# Patient Record
Sex: Female | Born: 2005
Health system: Southern US, Community
[De-identification: ages and names within clinical notes are randomized; demographics above are authoritative.]

## PROBLEM LIST (undated history)

## (undated) DIAGNOSIS — R569 Unspecified convulsions: Secondary | ICD-10-CM

## (undated) DIAGNOSIS — D496 Neoplasm of unspecified behavior of brain: Secondary | ICD-10-CM

## (undated) HISTORY — PX: BRAIN BIOPSY: SHX905

---
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
## 2023-01-01 LAB — 10-HYDROXYCARBAZEPINE: Triliptal/MTB(Oxcarbazepin): 30 ug/mL (ref 8.0–35.0)

## 2023-01-01 LAB — LEVETIRACETAM LEVEL: Keppra (Levetiracetam): 23.2 ug/mL

## 2023-02-19 ENCOUNTER — Emergency Department (HOSPITAL_COMMUNITY): Payer: Medicaid Other

## 2023-02-19 ENCOUNTER — Emergency Department (HOSPITAL_COMMUNITY)
Admission: EM | Admit: 2023-02-19 | Discharge: 2023-02-19 | Disposition: A | Discharge status: Home / Self Care | Payer: Medicaid Other | Source: Home / Self Care | Attending: Emergency Medicine | Admitting: Emergency Medicine

## 2023-02-19 ENCOUNTER — Other Ambulatory Visit: Payer: Self-pay

## 2023-02-19 ENCOUNTER — Encounter (HOSPITAL_COMMUNITY): Payer: Self-pay

## 2023-02-19 DIAGNOSIS — R569 Unspecified convulsions: Secondary | ICD-10-CM | POA: Diagnosis present

## 2023-02-19 DIAGNOSIS — C719 Malignant neoplasm of brain, unspecified: Secondary | ICD-10-CM

## 2023-02-19 DIAGNOSIS — C710 Malignant neoplasm of cerebrum, except lobes and ventricles: Secondary | ICD-10-CM | POA: Insufficient documentation

## 2023-02-19 LAB — URINALYSIS, ROUTINE W REFLEX MICROSCOPIC
Bacteria, UA: NONE SEEN
Bilirubin Urine: NEGATIVE
Glucose, UA: NEGATIVE mg/dL
Hgb urine dipstick: NEGATIVE
Ketones, ur: NEGATIVE mg/dL
Leukocytes,Ua: NEGATIVE
Nitrite: NEGATIVE
Protein, ur: NEGATIVE mg/dL
Specific Gravity, Urine: 1.028 (ref 1.005–1.030)
pH: 5 (ref 5.0–8.0)

## 2023-02-19 LAB — CBC WITH DIFFERENTIAL/PLATELET
Abs Immature Granulocytes: 0.01 10*3/uL (ref 0.00–0.07)
Basophils Absolute: 0 10*3/uL (ref 0.0–0.1)
Basophils Relative: 1 %
Eosinophils Absolute: 0 10*3/uL (ref 0.0–0.5)
Eosinophils Relative: 1 %
HCT: 41.8 % (ref 36.0–46.0)
Hemoglobin: 13.1 g/dL (ref 12.0–15.0)
Immature Granulocytes: 0 %
Lymphocytes Relative: 26 %
Lymphs Abs: 1.5 10*3/uL (ref 0.7–4.0)
MCH: 27.2 pg (ref 26.0–34.0)
MCHC: 31.3 g/dL (ref 30.0–36.0)
MCV: 86.7 fL (ref 80.0–100.0)
Monocytes Absolute: 0.4 10*3/uL (ref 0.1–1.0)
Monocytes Relative: 7 %
Neutro Abs: 3.8 10*3/uL (ref 1.7–7.7)
Neutrophils Relative %: 65 %
Platelets: 326 10*3/uL (ref 150–400)
RBC: 4.82 MIL/uL (ref 3.87–5.11)
RDW: 14.3 % (ref 11.5–15.5)
WBC: 5.7 10*3/uL (ref 4.0–10.5)
nRBC: 0 % (ref 0.0–0.2)

## 2023-02-19 LAB — BASIC METABOLIC PANEL
Anion gap: 11 (ref 5–15)
BUN: 9 mg/dL (ref 6–20)
CO2: 24 mmol/L (ref 22–32)
Calcium: 9.5 mg/dL (ref 8.9–10.3)
Chloride: 101 mmol/L (ref 98–111)
Creatinine, Ser: 0.96 mg/dL (ref 0.44–1.00)
GFR, Estimated: >60 mL/min (ref >60)
Glucose, Bld: 98 mg/dL (ref 70–99)
Potassium: 4.3 mmol/L (ref 3.5–5.1)
Sodium: 136 mmol/L (ref 135–145)

## 2023-02-19 LAB — POC URINE PREG, ED: Preg Test, Ur: NEGATIVE

## 2023-02-19 MED ORDER — GADOBUTROL 1 MMOL/ML IV SOLN
7.0000 mL | Freq: Once | INTRAVENOUS | Status: AC | PRN
  Administered 2023-02-19: 7 mL via INTRAVENOUS

## 2023-02-19 MED ORDER — LEVETIRACETAM IN NACL 1500 MG/100ML IV SOLN
1500.0000 mg | Freq: Once | INTRAVENOUS | Status: AC
  Administered 2023-02-19: 1500 mg via INTRAVENOUS
  Filled 2023-02-19: qty 100

## 2023-02-19 MED ORDER — OXCARBAZEPINE 300 MG PO TABS
600.0000 mg | ORAL_TABLET | Freq: Once | ORAL | Status: AC
  Administered 2023-02-19: 600 mg via ORAL
  Filled 2023-02-19: qty 2

## 2023-02-19 NOTE — ED Provider Notes (Signed)
  Physical Exam  BP 106/64   Pulse 88   Temp 98.6 F (37 C) (Oral)   Resp (!) 22   Ht 5' (1.524 m)   Wt 72.2 kg   SpO2 100%   BMI 31.09 kg/m   Physical Exam Vitals and nursing note reviewed.  Constitutional:      General: She is not in acute distress.    Appearance: She is well-developed.  HENT:     Head: Normocephalic and atraumatic.  Eyes:     Conjunctiva/sclera: Conjunctivae normal.  Cardiovascular:     Rate and Rhythm: Normal rate and regular rhythm.     Heart sounds: No murmur heard. Pulmonary:     Effort: Pulmonary effort is normal. No respiratory distress.     Breath sounds: Normal breath sounds.  Abdominal:     Palpations: Abdomen is soft.     Tenderness: There is no abdominal tenderness.  Musculoskeletal:        General: No swelling.     Cervical back: Neck supple.  Skin:    General: Skin is warm and dry.     Capillary Refill: Capillary refill takes less than 2 seconds.  Neurological:     General: No focal deficit present.     Mental Status: She is alert and oriented to person, place, and time.     Cranial Nerves: No cranial nerve deficit.     Sensory: No sensory deficit.     Motor: No weakness.  Psychiatric:        Mood and Affect: Mood normal.     Procedures  Procedures  ED Course / MDM   Clinical Course as of 02/19/23 1903  Sat Feb 19, 2023  1511 Known glioma w seizures. Felt off for 24h. Had a couple absence seizures this morning. Was confused afterwards. Labs ok, getting MRI to ensure tumor has not changed. Negative, fu OP w neuro [WL]    Clinical Course User Index [WL] Dyanne Iha, MD   Medical Decision Making Amount and/or Complexity of Data Reviewed Labs: ordered. Radiology: ordered.  Risk Prescription drug management.   Upon reassessment, patient remains resting comfortably with no new symptoms.  Remains in mental baseline.  MRI resulted with mild interval increase in size of glioma.  No concern for hemorrhagic transformation  or new masses.  Bedside conversation held with patient and her mother regarding decision to stay for neurology consultation versus close follow-up outpatient.  Considering patient and her mother have been here for several hours, decision made to return home and follow-up closely with neurology as an outpatient.  Instructed to continue taking AEDs as prescribed for now.  Strict return precautions voiced.  Instructed to follow-up with primary care as well as neurology.   Dyanne Iha, MD 02/19/23 Julian Reil    Glyn Ade, MD 02/19/23 [phone]

## 2023-02-19 NOTE — ED Provider Notes (Signed)
[place] EMERGENCY DEPARTMENT AT [facility] Provider Note   CSN: [id number] Arrival date & time: 02/19/23  1252    History  Chief Complaint  Patient presents with   Seizures    Mary Salinas is a 17 y.o. female for evaluation of seizure-like activity.  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 Mervyn Skeeters bdelmoumenwith: Peds neuro.  She is compliant with her Keppra and Trileptal.  Has felt off the last few days.  No headache, numbness, weakness.  Spent the night at her friend's house.  Friend reported a few times for patient would be talking and then would stare off into space and not respond.  Had confusion afterwards.  On my initial evaluation she is back to baseline.  No fever, congestion, rhinorrhea, chest pain, shortness of breath, dysuria or hematuria.  Denies chance of pregnancy.  No sick contacts.  No falling to the ground, recent head injury.  Per mother last seizure was months ago.  HPI     Home Medications Prior to Admission medications   Medication Sig Start Date End Date Taking? Authorizing Provider  levETIRAcetam (KEPPRA) 750 MG tablet Take 2 tablets (1,500 mg total) by mouth 2 (two) times daily. 12/27/22   Abdelmoumen, Jenna Luo, MD  NAYZILAM 5 MG/0.1ML SOLN Place 5 mg into the nose as needed (place 1 spary into one nostril if seizures lasted longer than2- 3 minutes, may repeat a second dose after 10 minutes if still seizing.). Patient not taking: Reported on 09/29/2022 01/23/22   Lezlie Lye, MD  oxcarbazepine (TRILEPTAL) 600 MG tablet Take 1.5 tablets (900 mg total) by mouth 2 (two) times daily. 09/29/22 12/30/22  Lezlie Lye, MD      Allergies    Patient has no known allergies.    Review of Systems   Review of Systems  Constitutional: Negative.   HENT: Negative.    Respiratory: Negative.    Cardiovascular: Negative.   Gastrointestinal: Negative.   Genitourinary: Negative.    Musculoskeletal: Negative.   Skin: Negative.   Neurological:  Positive for seizures.  All other systems reviewed and are negative.   Physical Exam Updated Vital Signs BP (!) 113/91   Pulse 88   Temp 98.4 F (36.9 C) (Oral)   Resp 19   Ht 5' (1.524 m)   Wt 72.2 kg   SpO2 100%   BMI 31.09 kg/m  Physical Exam Vitals and nursing note reviewed.  Constitutional:      General: She is not in acute distress.    Appearance: She is well-developed. She is not ill-appearing, toxic-appearing or diaphoretic.  HENT:     Head: Normocephalic and atraumatic.     Nose: Nose normal.     Mouth/Throat:     Mouth: Mucous membranes are moist.  Eyes:     Pupils: Pupils are equal, round, and reactive to light.  Cardiovascular:     Rate and Rhythm: Normal rate.     Pulses: Normal pulses.     Heart sounds: Normal heart sounds.  Pulmonary:     Effort: Pulmonary effort is normal. No respiratory distress.     Breath sounds: Normal breath sounds.  Abdominal:     General: Bowel sounds are normal. There is no distension.     Palpations: Abdomen is soft.  Musculoskeletal:        General: Normal range of motion.     Cervical back: Normal range of motion.  Skin:  General: Skin is warm and dry.     Capillary Refill: Capillary refill takes less than 2 seconds.  Neurological:     General: No focal deficit present.     Mental Status: She is alert and oriented to person, place, and time.     Cranial Nerves: No cranial nerve deficit.     Sensory: No sensory deficit.     Motor: No weakness.     Gait: Gait normal.  Psychiatric:        Mood and Affect: Mood normal.    ED Results / Procedures / Treatments   Labs (all labs ordered are listed, but only abnormal results are displayed) Labs Reviewed  URINALYSIS, ROUTINE W REFLEX MICROSCOPIC - Abnormal; Notable for the following components:      Result Value   APPearance HAZY (*)    All other components within normal limits  CBC WITH  DIFFERENTIAL/PLATELET  BASIC METABOLIC PANEL  LEVETIRACETAM LEVEL  10-HYDROXYCARBAZEPINE  POC URINE PREG, ED    EKG None  Radiology No results found.  Procedures Procedures    Medications Ordered in ED Medications  levETIRAcetam (KEPPRA) IVPB 1500 mg/ 100 mL premix (1,500 mg Intravenous New Bag/Given 02/19/23 1456)  Oxcarbazepine (TRILEPTAL) tablet 600 mg (600 mg Oral Given 02/19/23 1435)    ED Course/ Medical Decision Making/ A&P Clinical Course as of 02/19/23 1520  Sat Feb 19, 2023  1511 Known glioma w seizures. Felt off for 24h. Had a couple absence seizures this morning. Was confused afterwards. Labs ok, getting MRI to ensure tumor has not changed. Negative, fu OP w neuro [WL]    Clinical Course User Index [WL] Dyanne Iha, MD   17 here for evaluation of seizure-like activity.  Stayed at a friend's house yesterday had episodes where she would stare off into space which is consistent with her seizures.  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 Mervyn Skeeters with [facility] pediatric neurology.  She admits to compliance with medications.  Has felt off however no headache.  She is nonfocal neuroexam without deficits.  No fever, neck pain or neck stiffness.  I reviewed her last MRI from [facility] which showed unchanged glioma.  She had not taken her morning medications when she arrived to the ED today, will give her medications.  Will plan on labs, imaging, due to feeling off, seizure will obtain MRI to rule out change in her tumor.  Labs and imaging personally viewed and interpreted:  CBC without leukocytosis BMP without significant abnormality UA negative for infection Pregnancy test negative EKG without ischemic changes   Care transferred to oncoming provider who will follow-up on remaining labs, imaging.  Suspect if imaging unchanged from prior and patient back to baseline she can be DC home and follow-up with neurology                                 Medical Decision Making Amount and/or Complexity of Data Reviewed Independent Historian: parent and EMS External Data Reviewed: labs, radiology and notes. Labs: ordered. Decision-making details documented in ED Course. Radiology: ordered and independent interpretation performed. Decision-making details documented in ED Course.  Risk OTC drugs. Prescription drug management. Parenteral controlled substances. Decision regarding hospitalization. Diagnosis or treatment significantly limited by social determinants of health.          Final Clinical Impression(s) / ED Diagnoses Final diagnoses:  Seizure (HCC)  Glioma [facility])    Rx / DC Orders ED Discharge Orders     None         Makena Murdock A, PA-C 02/19/23 1520    Arby Barrette, MD 02/23/23 [phone]

## 2023-02-19 NOTE — Discharge Instructions (Addendum)
You were seen today for seizures. While you were here we monitored your vitals, preformed a physical exam, and checked a brain MRI. This demonstrated that there is a very slight increase in the size of the brain tumor.  After conversation with you all, we have determined that it is safe and reasonable to follow-up with neurology as an outpatient to determine best next steps and any changes that need to be made in terms of medication management.  Because you have returned to normal mental baseline, we have determined that you are safe and stable for discharge.  Things to do:  - Follow up with your primary care provider within the next 1-2 weeks -Please be sure to follow-up with your neurologist in the next few days - Be sure to continue taking antiepileptic medications as prescribed  Return to the emergency department if you have any new or worsening symptoms including additional seizures, extreme headache, numbness, weakness, chest pain, shortness of breath, or if you have any other concerns.

## 2023-02-19 NOTE — ED Triage Notes (Signed)
Per EMS, pt had a seizure after spending the night at a friend's house. Pt is compliant with her meds. The friend reported the pt would be talking and then would just stare off and not respond. Pt is still confused. Cannot think of correct words to answer questions. Speech is clear with no slurring.

## 2023-02-21 LAB — LEVETIRACETAM LEVEL: Levetiracetam Lvl: 12.4 ug/mL (ref 10.0–40.0)

## 2023-02-23 ENCOUNTER — Other Ambulatory Visit (INDEPENDENT_AMBULATORY_CARE_PROVIDER_SITE_OTHER): Payer: Self-pay | Admitting: Pediatrics

## 2023-02-23 DIAGNOSIS — G40209 Localization-related (focal) (partial) symptomatic epilepsy and epileptic syndromes with complex partial seizures, not intractable, without status epilepticus: Secondary | ICD-10-CM

## 2023-02-23 DIAGNOSIS — C719 Malignant neoplasm of brain, unspecified: Secondary | ICD-10-CM

## 2023-02-23 LAB — 10-HYDROXYCARBAZEPINE: Triliptal/MTB(Oxcarbazepin): 30 ug/mL (ref 10–35)

## 2023-02-23 MED ORDER — OXCARBAZEPINE 600 MG PO TABS
900.0000 mg | ORAL_TABLET | Freq: Two times a day (BID) | ORAL | 0 refills | Status: DC
Start: 2023-02-23 — End: 2023-02-24

## 2023-02-23 NOTE — Telephone Encounter (Signed)
  Name of who is calling: Mary Salinas   Caller's Relationship to Patient: Mom  Best contact number: [phone]  Provider they see: Dr Moody Bruins   Reason for call: Mom called about oxcarbazepine medication, she says she already talked to the pharmacy and was directed to call us for a refill first. Mom states she needs it very soon because she has absolutely none left.     PRESCRIPTION REFILL ONLY  Name of prescription: Oxcarbazepine   Pharmacy: [id number] [phone]

## 2023-02-23 NOTE — Telephone Encounter (Signed)
Refill sent to provider

## 2023-02-24 MED ORDER — LEVETIRACETAM 750 MG PO TABS
1500.0000 mg | ORAL_TABLET | Freq: Two times a day (BID) | ORAL | 1 refills | Status: DC
Start: 2023-02-24 — End: 2023-07-13

## 2023-02-24 MED ORDER — OXCARBAZEPINE 600 MG PO TABS
900.0000 mg | ORAL_TABLET | Freq: Two times a day (BID) | ORAL | 2 refills | Status: DC
Start: 2023-02-24 — End: 2023-06-17

## 2023-02-24 NOTE — Addendum Note (Signed)
Addended byKeturah Shavers on: 02/24/2023 04:39 PM   Modules accepted: Orders

## 2023-05-17 ENCOUNTER — Ambulatory Visit (INDEPENDENT_AMBULATORY_CARE_PROVIDER_SITE_OTHER): Payer: Medicaid Other | Admitting: Pediatrics

## 2023-05-17 ENCOUNTER — Encounter (INDEPENDENT_AMBULATORY_CARE_PROVIDER_SITE_OTHER): Payer: Self-pay | Admitting: Pediatrics

## 2023-05-17 VITALS — BP 112/72 | HR 64 | Ht 59.45 in | Wt 160.5 lb

## 2023-05-17 DIAGNOSIS — C719 Malignant neoplasm of brain, unspecified: Secondary | ICD-10-CM | POA: Diagnosis not present

## 2023-05-17 DIAGNOSIS — G40209 Localization-related (focal) (partial) symptomatic epilepsy and epileptic syndromes with complex partial seizures, not intractable, without status epilepticus: Secondary | ICD-10-CM

## 2023-05-17 NOTE — Progress Notes (Unsigned)
Patient: Mary Salinas MRN: [id number] Sex: female DOB: Dec 02, 2005  Provider: Lezlie Lye, MD Location of Care: Pediatric Specialist- Pediatric Neurology Note type: Progress note Chief Complaint: focal epilepsy.  Interim History: Mary Salinas is a 17 y.o. 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 Mary Salinas was last seen in child neurology clinic on 09/29/2022.  Her Keppra dose was increased to 1500 mg twice a day and oxcarbazepine 900 mg twice a day from last visit.  The patient said that she left college camps in April 2024 and returned back to [place].  She is staying with her mother for now.  She is working at [employer] and [employer].  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  The patient has no problems taking orders.  The patient reported that she was told that she had 1 or 2 episodes of transient loss of awareness.  The first seizure happened at work earlier this month, she was told by coworker that she zoned out (staring into space and was unresponsive to verbal or finger snapping in front of her face) at work for approximately <2 minutes.  Earlier that day, the patient said she was sleeping and forgot to go to work.  She woke up and rushed out of the bed and went to work.  She said it was a busy day at work and felt tired.  The next episode happened at her mom's house, the patient does not remember what happened but her brother said that she was confused, staring off and unresponsive.  They had to put her in the bed to go back to sleep.  Again, this happened in the morning as well.  The patient had to cut back on her hours of work because of this 2 episodes of likely seizure.  The patient states that she has been taking her antiseizure medication as prescribed and never missed any doses.  Follow-up 09/29/2022: She has been  taking Keppra 1250 mg BID and oxcarbazepine 600 mg BID. Antiseizure medication trough levels checked on 01/28/2022. Keppra level was 17.8 and oxcarbazepine was 23.4 both were therapeutic.   She is in college at [employer] and doing well in school. She reported zooning out that occur randomly probably 1-2 times a month. zooning out may varies in duration but mostly less than 3 minutes in duration. On August 25, 2022, Mary Salinas said that she woke up early to get ready for her classes. However, she missed the class that day. A girl who shares the room with her in the dorm witnessed that Mary Salinas dropped stuff from her hands, then fell on the ground and zooned out. the girl called her name and waved her hands in front of her face, but Mary Salinas was unresponsive. Mary Salinas said that she stood up and went back to sleep and does not have any memory about what happened.   She was told that she was zooning out and unresponsive on March 3rd which could be her last seizure. Mary Salinas said that she never missed taking Keppra or oxcarbazepine.   She was seen by neurosurgery on July 25, 2022.As part of her workup an MRI scan was obtained that demonstrated a left occipital brain lesion. 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 She denies any difficulty with her vision but was not able  to undergo formal visual field testing with an ophthalmologist. She has a new MRI scan that demonstrates no significant change in the left occipital temporal brain lesion beyond her postoperative changes. She was evaluated by ophthalmology for visual field on 09/04/2022. + right homonymous superior quadrantanopia. The patient reported that she had IUD in March 2024 to prevent pregnancy.  Follow up 01/21/2022:  She that was seen on child neurology clinic for follow-up in February 2023.  She then had no seizures since last visit until January 10, 2022.  Had 1 seizure witnessed by by her  sister who heard the sound from her sister.  Mary Salinas was sitting on the toilet, suddenly was about to fall from toilet seat as she was staring off, right arm raised up and followed with stiffness of both arms associated with loss of consciousness but no urinary or bowel incontinence.  Seizure lasted approximately 2 minutes.  Postictally, she was confused.  Denied head trauma or injury and no illness.  She was brought to the emergency room at [facility] and was back to baseline after few hours.  Mary Salinas states that she missed Keppra and Trileptal the day before because her mother was unable to refill her prescription as pharmacy had already closed yesterday.  She is taking and tolerating Keppra 1250 mg twice a day and Trileptal 600 mg twice a day, with no side effects.  She reports sleeping throughout the night and maintaining her hydration well.  Last menstrual period January 14, 2022.  On September 07, 2021; Mary Salinas had postoperative follow-up status post biopsy of left occipital brain lesion (low-grade astrocytoma) with neurosurgery.  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  The plan is to add her to tumor board to discuss treatment option and add her to pediatric neuro-oncology clinic.  Mary Salinas missed an appointment for MRI brain with and without contrast in June 2023.  She was scheduled for pediatric oncology in May 20, 2022 and also has a follow-up with neurosurgery in November same day.  Epilepsy/seizure History:  Age at seizure onset: 02/10/2018 Description of all seizure types and duration: Semiology #1 falls to the floor, rolling of the eyes, biting, jerking of every part of body 3 min in duration Semiology #2 zone out, staring, hands go to the middle and "lock up"   Complications from seizures (trauma, etc.): No h/o status epilepticus: No  Date of most recent seizure: June 2024 She experienced 1 seizure in 2021, 1 seizure in April 2022, 2 seizures in  September 2022, 4 seizures in October, 2022, and 2 seizures so far in November 2022 and last seizure in July 2023 in setting of missing antiseizure medications. In June 2024; the patient had 2 episodes of transient loss of awareness lasting 2 minutes in duration.  Current AEDs: Keppra 1500 twice a day Trileptal 900 mg twice a day Current side effects: None   Prior AEDs (d/c reason?): None Adherence Estimate: [x ] good  Previous work up:  MRI brain with and without contrast 05/20/2021 unchanged appearance of hyperintense T2-weighted signal lesion abutting the atrium of the left lateral ventricle, consistent with glioma. MRI scan of the brain completed on (03/23/2018) shows evidence of a 3 x 2 x 2 cm lesion in the left mesial posterior temporal lobe, possibly involving the inferior parietal and a portion of the occipital lobe as well.  This shows increased signal in FLAIR and T2 decreased signal in T1 and no evidence of enhancement.  There  does not appear to be any mass effect associated with it.  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 MRI scans without and with contrast performed 07/07/2018, 01/20/2019, and 04/01/2020 were unchanged. Findings most compatible with stable low-grade glioma.    Normal EEG awake, drowsy, and asleep (02/20/2018).  EEG (05/13/2021) :Routine video EEG was abnormal in wakefulness and sleep due to frequent focal epileptiform discharges in the left fronto-temporal regions.   Past Medical History: Low-grade astrocytoma Focal epilepsy Twin birth  Past Surgical History: None  Allergy: No Known Allergies  Medications: Current Outpatient Medications on File Prior to Visit  Medication Sig Dispense Refill   levETIRAcetam (KEPPRA) 750 MG tablet Take 2 tablets (1,500 mg total) by mouth 2 (two) times daily. 360 tablet 1   oxcarbazepine (TRILEPTAL) 600 MG tablet Take 1.5 tablets (900 mg total) by mouth 2 (two) times daily. 90 tablet 2   NAYZILAM 5  MG/0.1ML SOLN Place 5 mg into the nose as needed (place 1 spary into one nostril if seizures lasted longer than2- 3 minutes, may repeat a second dose after 10 minutes if still seizing.). (Patient not taking: Reported on 09/29/2022) 2 each 3   No current facility-administered medications on file prior to visit.    Birth History she was born full-term via repeat c-section with no perinatal events.  her birth weight was 5 lbs. 2oz.  She did not require a NICU stay. She was discharged home after birth. She passed the newborn screen, hearing test and congenital heart screen.    Developmental history: she achieved developmental milestone at appropriate age.   Schooling: she attends [employer].    social and family history: she lives with mother. she has brothers and sisters.  Both parents are in apparent good health. Siblings are also healthy. There is no family history of speech delay, learning difficulties in school, intellectual disability, epilepsy or neuromuscular disorders.   Review of Systems Constitutional: Negative for fever, malaise/fatigue and weight loss.  HENT: Negative for congestion, ear pain, hearing loss, sinus pain and sore throat.   Eyes: Negative for blurred vision, double vision, photophobia, discharge and redness.  Respiratory: Negative for cough, shortness of breath and wheezing.   Cardiovascular: Negative for chest pain, palpitations and leg swelling.  Gastrointestinal: Negative for abdominal pain, blood in stool, constipation, nausea and vomiting.  Genitourinary: Negative for dysuria and frequency.  Musculoskeletal: Negative for back pain, falls, joint pain and neck pain.  Skin: Negative for rash.  Neurological: Negative for dizziness, tremors, focal weakness, weakness and headaches. Positive for seizures Psychiatric/Behavioral: Negative for memory loss. The patient is not nervous/anxious and does not have insomnia.   EXAMINATION Physical examination: Today's Vitals    05/17/23 1502  BP: 112/72  Pulse: 64  Weight: 160 lb 7.9 oz (72.8 kg)  Height: 4' 11.45" (1.51 m)   Body mass index is 31.93 kg/m.   General: NAD, well nourished  HEENT: normocephalic, no eye or nose discharge.  MMM  Cardiovascular: warm and well perfused Lungs: Normal work of breathing, no rhonchi or stridor Skin: No birthmarks, no skin breakdown Abdomen: soft, non tender, non distended Extremities: No contractures or edema. Neuro: EOM intact, face symmetric. Moves all extremities equally and at least antigravity. No abnormal movements. Normal gait.    Assessment and Plan Mary Salinas is a 17 y.o. 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  Mary Salinas the patient is taking Keppra 1500  mg twice a day and oxcarbazepine 900 mg twice a day.  The patient had 2 episodes of unresponsive and staring off lasting 2 minutes in duration.  The patient had brain biopsy pathology resulted low-grade astrocytoma, WHO 2, negative for mutant IDH 1.  Has a follow-up with pediatric oncology and pediatric neurosurgery. Physical neurological examinations unremarkable.  Keppra and oxcarbazepine trough level are pending result.   PLAN: Continue Keppra 1500 mg twice a day Continue Trileptal 900 mg twice a day  Keppra and oxcarbazepine trough level before the morning dose are pending result Follow-up as scheduled No driving until seizure-free for 6 months   Counseling/Education: No driving, pregnancy and risk for birth complications due to seizures and antiseizure medications. Birth control.   The plan of care was discussed, with acknowledgement of understanding expressed by patient and her mother.   30 minutes with the patient and provided 50% counseling  This document was prepared using Dragon Voice Recognition software and may include unintentional dictation errors.  Lezlie Lye, MD Neurology and  epilepsy attending  child neurology

## 2023-05-21 ENCOUNTER — Encounter (INDEPENDENT_AMBULATORY_CARE_PROVIDER_SITE_OTHER): Payer: Self-pay | Admitting: Pediatrics

## 2023-06-17 ENCOUNTER — Other Ambulatory Visit (INDEPENDENT_AMBULATORY_CARE_PROVIDER_SITE_OTHER): Payer: Self-pay | Admitting: Neurology

## 2023-06-17 DIAGNOSIS — C719 Malignant neoplasm of brain, unspecified: Secondary | ICD-10-CM

## 2023-07-12 ENCOUNTER — Other Ambulatory Visit (INDEPENDENT_AMBULATORY_CARE_PROVIDER_SITE_OTHER): Payer: Self-pay | Admitting: Neurology

## 2023-07-12 DIAGNOSIS — G40209 Localization-related (focal) (partial) symptomatic epilepsy and epileptic syndromes with complex partial seizures, not intractable, without status epilepticus: Secondary | ICD-10-CM

## 2023-07-24 ENCOUNTER — Other Ambulatory Visit (INDEPENDENT_AMBULATORY_CARE_PROVIDER_SITE_OTHER): Payer: Self-pay | Admitting: Pediatrics

## 2023-07-24 DIAGNOSIS — C719 Malignant neoplasm of brain, unspecified: Secondary | ICD-10-CM

## 2023-07-25 NOTE — Telephone Encounter (Signed)
Last OV 05/17/2023- Next OV with Adult Neuro 08/30/23 Rx refilled in Dec as 90 d- sent this refill as 30 day until sees new provider

## 2023-08-24 ENCOUNTER — Other Ambulatory Visit: Payer: Self-pay | Admitting: Neurology

## 2023-08-24 DIAGNOSIS — C719 Malignant neoplasm of brain, unspecified: Secondary | ICD-10-CM

## 2023-08-26 ENCOUNTER — Emergency Department (HOSPITAL_COMMUNITY): Payer: Medicaid Other

## 2023-08-26 ENCOUNTER — Other Ambulatory Visit: Payer: Self-pay

## 2023-08-26 ENCOUNTER — Emergency Department (HOSPITAL_COMMUNITY)
Admission: EM | Admit: 2023-08-26 | Discharge: 2023-08-26 | Disposition: A | Discharge status: Home / Self Care | Payer: Medicaid Other

## 2023-08-26 DIAGNOSIS — R569 Unspecified convulsions: Secondary | ICD-10-CM

## 2023-08-26 DIAGNOSIS — M79644 Pain in right finger(s): Secondary | ICD-10-CM | POA: Insufficient documentation

## 2023-08-26 DIAGNOSIS — C719 Malignant neoplasm of brain, unspecified: Secondary | ICD-10-CM | POA: Insufficient documentation

## 2023-08-26 DIAGNOSIS — G40909 Epilepsy, unspecified, not intractable, without status epilepticus: Secondary | ICD-10-CM | POA: Diagnosis not present

## 2023-08-26 LAB — COMPREHENSIVE METABOLIC PANEL
ALT: 24 U/L (ref 0–44)
AST: 40 U/L (ref 15–41)
Albumin: 4.3 g/dL (ref 3.5–5.0)
Alkaline Phosphatase: 70 U/L (ref 38–126)
Anion gap: 15 (ref 5–15)
BUN: 8 mg/dL (ref 6–20)
CO2: 15 mmol/L — ABNORMAL LOW (ref 22–32)
Calcium: 9 mg/dL (ref 8.9–10.3)
Chloride: 104 mmol/L (ref 98–111)
Creatinine, Ser: 1.45 mg/dL — ABNORMAL HIGH (ref 0.44–1.00)
GFR, Estimated: 53 mL/min — ABNORMAL LOW (ref >60)
Glucose, Bld: 211 mg/dL — ABNORMAL HIGH (ref 70–99)
Potassium: 4.5 mmol/L (ref 3.5–5.1)
Sodium: 134 mmol/L — ABNORMAL LOW (ref 135–145)
Total Bilirubin: 0.5 mg/dL (ref 0.0–1.2)
Total Protein: 7.7 g/dL (ref 6.5–8.1)

## 2023-08-26 LAB — CBC WITH DIFFERENTIAL/PLATELET
Abs Immature Granulocytes: 0.12 10*3/uL — ABNORMAL HIGH (ref 0.00–0.07)
Basophils Absolute: 0.1 10*3/uL (ref 0.0–0.1)
Basophils Relative: 0 %
Eosinophils Absolute: 0 10*3/uL (ref 0.0–0.5)
Eosinophils Relative: 0 %
HCT: 42.5 % (ref 36.0–46.0)
Hemoglobin: 13.3 g/dL (ref 12.0–15.0)
Immature Granulocytes: 1 %
Lymphocytes Relative: 9 %
Lymphs Abs: 1.5 10*3/uL (ref 0.7–4.0)
MCH: 27.5 pg (ref 26.0–34.0)
MCHC: 31.3 g/dL (ref 30.0–36.0)
MCV: 88 fL (ref 80.0–100.0)
Monocytes Absolute: 0.5 10*3/uL (ref 0.1–1.0)
Monocytes Relative: 3 %
Neutro Abs: 15.4 10*3/uL — ABNORMAL HIGH (ref 1.7–7.7)
Neutrophils Relative %: 87 %
Platelets: 368 10*3/uL (ref 150–400)
RBC: 4.83 MIL/uL (ref 3.87–5.11)
RDW: 13 % (ref 11.5–15.5)
WBC: 17.6 10*3/uL — ABNORMAL HIGH (ref 4.0–10.5)
nRBC: 0 % (ref 0.0–0.2)

## 2023-08-26 LAB — CBG MONITORING, ED: Glucose-Capillary: 99 mg/dL (ref 70–99)

## 2023-08-26 LAB — HCG, SERUM, QUALITATIVE: Preg, Serum: NEGATIVE

## 2023-08-26 MED ORDER — LEVETIRACETAM 500 MG PO TABS
1500.0000 mg | ORAL_TABLET | Freq: Once | ORAL | Status: AC
  Administered 2023-08-26: 1500 mg via ORAL
  Filled 2023-08-26: qty 3

## 2023-08-26 MED ORDER — OXCARBAZEPINE 600 MG PO TABS: 900.0000 mg | ORAL_TABLET | Freq: Two times a day (BID) | ORAL | 0 refills | Status: DC

## 2023-08-26 MED ORDER — LEVETIRACETAM IN NACL 1000 MG/100ML IV SOLN: 1000.0000 mg | Freq: Once | INTRAVENOUS | Status: DC

## 2023-08-26 MED ORDER — ACETAMINOPHEN 500 MG PO TABS
1000.0000 mg | ORAL_TABLET | Freq: Once | ORAL | Status: AC
  Administered 2023-08-26: 1000 mg via ORAL
  Filled 2023-08-26: qty 2

## 2023-08-26 MED ORDER — OXCARBAZEPINE 300 MG PO TABS
900.0000 mg | ORAL_TABLET | Freq: Once | ORAL | Status: AC
  Administered 2023-08-26: 900 mg via ORAL
  Filled 2023-08-26: qty 3

## 2023-08-26 NOTE — ED Provider Notes (Signed)
 Assume Care Medical Decision Making  Patient care of assumed from previous provider at change of shift. Please see the original provider note from this emergency department encounter for full history and physical.   Briefly, Mary Salinas is a 18 y.o. female who presents with:  Clinical Course as of 08/26/23 1632  Fri Aug 26, 2023  1511 Hx of seizure and astrocytoma, had seizure today, has not been taking Trileptal since she ran out. She fell forward onto face. -CTH, CT face, c-spine negative -f/u XR hand -DC with Trileptal Rx and neuro f/u [RK]    Clinical Course User Index [RK] Mary Presume, MD     Please refer to the original provider's note for additional information regarding the care of Mary Salinas.  Labs reviewed by myself and considered in medical decision making.  Imaging reviewed by myself and considered in medical decision making. Imaging final read interpreted by radiology.  Additional MDM/ED Course: Briefly this is a 18 year old patient with history of seizures on Keppra and Trileptal who is presenting for seizure after running out of her Trileptal for the past few days.  She did have a fall. Obtain CT head, CT C-spine, and CT face that were all reassuring and did not show any traumatic injury. X-ray of her right thumb did show possible acute fracture of the distal phalanx. Patient was placed in a finger splint.  On assessment of patient, she is awake and alert.  Tolerating p.o. well. I discussed plan for discharge home with Trileptal prescription and she voiced agreement with this plan. She will call her neurologist to see if she leaves the hospital to arrange close follow-up  Disposition:  I discussed the plan for discharge with the patient and/or their surrogate at bedside prior to discharge and they were in agreement with the plan and verbalized understanding of the return precautions provided. All questions answered to the best of my ability. Ultimately, the  patient was discharged in stable condition with stable vital signs. I am reassured that they are capable of close follow up and good social support at home.   Clinical Impression:  1. Seizure (HCC)   2. Glioma (HCC)     Rx / DC Orders ED Discharge Orders          Ordered    oxcarbazepine (TRILEPTAL) 600 MG tablet  2 times daily       Note to Pharmacy: Patient transferring to Mary Salinas at [facility] in Feb   08/26/23 1449    oxcarbazepine (TRILEPTAL) 600 MG tablet  2 times daily        08/26/23 1627            The plan for this patient was discussed with Mary Salinas, who voiced agreement and who oversaw evaluation and treatment of this patient.    Mary Presume, MD 08/26/23 1634    Mary Foots, DO 08/28/23 1215

## 2023-08-26 NOTE — ED Provider Notes (Signed)
[place] EMERGENCY DEPARTMENT AT [facility] Provider Note   CSN: [id number] Arrival date & time: 08/26/23  1130     History  Chief Complaint  Patient presents with   Seizures   HPI Mary Salinas is a 18 y.o. female with history of focal epilepsy and low-grade astrocytoma presenting for seizures.  States she believes it may have occurred around 10 AM this morning.  States she remembers waking up on the bathroom floor.  She called her mother who then called EMS.  She also remembers there were a lot of "medical people" around her.  Endorses tongue biting.  Also states that her head and her nose hurt at this time.  Unsure if she fell.  Also endorsing right thumb pain.  States she continues to take her Keppra as prescribed but has not taken her Trileptal for the past few days because she is not able to refill her prescription until next week.  Denies recent alcohol use.  Denies cough and fever.   Seizures      Home Medications Prior to Admission medications   Medication Sig Start Date End Date Taking? Authorizing Provider  levETIRAcetam (KEPPRA) 750 MG tablet TAKE 2 TABLETS (1,500 MG TOTAL) BY MOUTH 2 (TWO) TIMES DAILY. 07/13/23   Abdelmoumen, Jenna Luo, MD  NAYZILAM 5 MG/0.1ML SOLN Place 5 mg into the nose as needed (place 1 spary into one nostril if seizures lasted longer than2- 3 minutes, may repeat a second dose after 10 minutes if still seizing.). Patient not taking: Reported on 09/29/2022 01/23/22   Lezlie Lye, MD  oxcarbazepine (TRILEPTAL) 600 MG tablet Take 1.5 tablets (900 mg total) by mouth 2 (two) times daily. 08/26/23 09/25/23  Gareth Eagle, PA-C      Allergies    Patient has no known allergies.    Review of Systems   Review of Systems  Neurological:  Positive for seizures.    Physical Exam Updated Vital Signs BP (!) 109/58 (BP Location: Left Arm)   Pulse (!) 113   Temp 98 F (36.7 C)   Resp 17   LMP 08/07/2023 (Exact Date)   SpO2 100%  Physical  Exam Vitals and nursing note reviewed.  HENT:     Head: Normocephalic and atraumatic.     Comments: Bite marks noted on the anterior tongue.  Not actively bleeding    Nose:     Comments: Dried blood noted in both nares.  No Battle sign, raccoon eyes or hemotympanum.    Mouth/Throat:     Mouth: Mucous membranes are moist.  Eyes:     General:        Right eye: No discharge.        Left eye: No discharge.     Conjunctiva/sclera: Conjunctivae normal.  Cardiovascular:     Rate and Rhythm: Normal rate and regular rhythm.     Pulses: Normal pulses.     Heart sounds: Normal heart sounds.  Pulmonary:     Effort: Pulmonary effort is normal.     Breath sounds: Normal breath sounds.  Abdominal:     General: Abdomen is flat.     Palpations: Abdomen is soft.  Musculoskeletal:     Right hand: Tenderness present.     Left hand: Normal.     Comments: No obvious deformities.  Tenderness with palpation of the distal right thumb.  Skin:    General: Skin is warm and dry.  Neurological:     General: No focal deficit present.  Psychiatric:        Mood and Affect: Mood normal.     ED Results / Procedures / Treatments   Labs (all labs ordered are listed, but only abnormal results are displayed) Labs Reviewed  COMPREHENSIVE METABOLIC PANEL - Abnormal; Notable for the following components:      Result Value   Sodium 134 (*)    CO2 15 (*)    Glucose, Bld 211 (*)    Creatinine, Ser 1.45 (*)    GFR, Estimated 53 (*)    All other components within normal limits  CBC WITH DIFFERENTIAL/PLATELET - Abnormal; Notable for the following components:   WBC 17.6 (*)    Neutro Abs 15.4 (*)    Abs Immature Granulocytes 0.12 (*)    All other components within normal limits  HCG, SERUM, QUALITATIVE  CBG MONITORING, ED    EKG None  Radiology CT Cervical Spine Wo Contrast Result Date: 08/26/2023 CLINICAL DATA:  Unwitnessed seizure EXAM: CT HEAD WITHOUT CONTRAST CT MAXILLOFACIAL WITHOUT CONTRAST CT  CERVICAL SPINE WITHOUT CONTRAST TECHNIQUE: Multidetector CT imaging of the head, cervical spine, and maxillofacial structures were performed using the standard protocol without intravenous contrast. Multiplanar CT image reconstructions of the cervical spine and maxillofacial structures were also generated. RADIATION DOSE REDUCTION: This exam was performed according to the departmental dose-optimization program which includes automated exposure control, adjustment of the mA and/or kV according to patient size and/or use of iterative reconstruction technique. COMPARISON:  None Available. FINDINGS: CT HEAD FINDINGS Brain: No evidence of acute infarction, hemorrhage, hydrocephalus, extra-axial collection or mass lesion/mass effect. Vascular: No hyperdense vessel or unexpected calcification. CT FACIAL BONES FINDINGS Skull: Normal. Negative for fracture or focal lesion. Facial bones: No displaced fractures or dislocations. Sinuses/Orbits: No acute finding. Other: None. CT CERVICAL SPINE FINDINGS Alignment: Normal. Skull base and vertebrae: No acute fracture. No primary bone lesion or focal pathologic process. Soft tissues and spinal canal: No prevertebral fluid or swelling. No visible canal hematoma. Disc levels:  Intact. Upper chest: Negative. Other: None. IMPRESSION: 1. No acute intracranial pathology. 2. No displaced fractures or dislocations of the facial bones. 3. No fracture or static subluxation of the cervical spine. Electronically Signed   By: Jearld Lesch M.D.   On: 08/26/2023 14:09   CT Head Wo Contrast Result Date: 08/26/2023 CLINICAL DATA:  Unwitnessed seizure EXAM: CT HEAD WITHOUT CONTRAST CT MAXILLOFACIAL WITHOUT CONTRAST CT CERVICAL SPINE WITHOUT CONTRAST TECHNIQUE: Multidetector CT imaging of the head, cervical spine, and maxillofacial structures were performed using the standard protocol without intravenous contrast. Multiplanar CT image reconstructions of the cervical spine and maxillofacial  structures were also generated. RADIATION DOSE REDUCTION: This exam was performed according to the departmental dose-optimization program which includes automated exposure control, adjustment of the mA and/or kV according to patient size and/or use of iterative reconstruction technique. COMPARISON:  None Available. FINDINGS: CT HEAD FINDINGS Brain: No evidence of acute infarction, hemorrhage, hydrocephalus, extra-axial collection or mass lesion/mass effect. Vascular: No hyperdense vessel or unexpected calcification. CT FACIAL BONES FINDINGS Skull: Normal. Negative for fracture or focal lesion. Facial bones: No displaced fractures or dislocations. Sinuses/Orbits: No acute finding. Other: None. CT CERVICAL SPINE FINDINGS Alignment: Normal. Skull base and vertebrae: No acute fracture. No primary bone lesion or focal pathologic process. Soft tissues and spinal canal: No prevertebral fluid or swelling. No visible canal hematoma. Disc levels:  Intact. Upper chest: Negative. Other: None. IMPRESSION: 1. No acute intracranial pathology. 2. No displaced fractures or dislocations of the facial bones.  3. No fracture or static subluxation of the cervical spine. Electronically Signed   By: Jearld Lesch M.D.   On: 08/26/2023 14:09   CT Maxillofacial WO CM Result Date: 08/26/2023 CLINICAL DATA:  Unwitnessed seizure EXAM: CT HEAD WITHOUT CONTRAST CT MAXILLOFACIAL WITHOUT CONTRAST CT CERVICAL SPINE WITHOUT CONTRAST TECHNIQUE: Multidetector CT imaging of the head, cervical spine, and maxillofacial structures were performed using the standard protocol without intravenous contrast. Multiplanar CT image reconstructions of the cervical spine and maxillofacial structures were also generated. RADIATION DOSE REDUCTION: This exam was performed according to the departmental dose-optimization program which includes automated exposure control, adjustment of the mA and/or kV according to patient size and/or use of iterative reconstruction  technique. COMPARISON:  None Available. FINDINGS: CT HEAD FINDINGS Brain: No evidence of acute infarction, hemorrhage, hydrocephalus, extra-axial collection or mass lesion/mass effect. Vascular: No hyperdense vessel or unexpected calcification. CT FACIAL BONES FINDINGS Skull: Normal. Negative for fracture or focal lesion. Facial bones: No displaced fractures or dislocations. Sinuses/Orbits: No acute finding. Other: None. CT CERVICAL SPINE FINDINGS Alignment: Normal. Skull base and vertebrae: No acute fracture. No primary bone lesion or focal pathologic process. Soft tissues and spinal canal: No prevertebral fluid or swelling. No visible canal hematoma. Disc levels:  Intact. Upper chest: Negative. Other: None. IMPRESSION: 1. No acute intracranial pathology. 2. No displaced fractures or dislocations of the facial bones. 3. No fracture or static subluxation of the cervical spine. Electronically Signed   By: Jearld Lesch M.D.   On: 08/26/2023 14:09    Procedures Procedures    Medications Ordered in ED Medications  acetaminophen (TYLENOL) tablet 1,000 mg (1,000 mg Oral Given 08/26/23 1211)  levETIRAcetam (KEPPRA) tablet 1,500 mg (1,500 mg Oral Given 08/26/23 1211)  Oxcarbazepine (TRILEPTAL) tablet 900 mg (900 mg Oral Given 08/26/23 1211)    ED Course/ Medical Decision Making/ A&P Clinical Course as of 08/26/23 1522  Fri Aug 26, 2023  1511 Hx of seizure and astrocytoma, had seizure today, has not been taking Trileptal since she ran out. She fell forward onto face. -CTH, CT face, c-spine negative -f/u XR hand -DC with Trileptal Rx and neuro f/u [RK]    Clinical Course User Index [RK] Caron Presume, MD                                 Medical Decision Making Amount and/or Complexity of Data Reviewed Labs: ordered. Radiology: ordered.  Risk OTC drugs. Prescription drug management.   Initial Impression and Ddx 18 year old well-appearing female pending for seizure.  Exam notable for blood  in both nares, tongue biting, otherwise reassuring.  DDx includes seizure, facial trauma, intracranial and skull trauma, neck trauma, electrolyte derangement, other. Patient PMH that increases complexity of ED encounter:   focal epilepsy and low-grade astrocytoma  Interpretation of Diagnostics I independent reviewed and interpreted the labs as followed: leukocytosis (17.6)  - I independently visualized the following imaging with scope of interpretation limited to determining acute life threatening conditions related to emergency care: CT scans, which revealed no acute findings.  X-ray of the right hand is pending.  Patient Reassessment and Ultimate Disposition/Management On reassessment, patient stated that her head pain had improved. There was some swelling and bruising noted in the right thumb. X-ray of the right hand pending. No witnessed seizure or seizure-like activity during encounter.  Patient does have close follow-up with neurology.  Signed out patient to oncoming ED resident, Freda Munro,  MD. plan will be to follow-up on right hand x-ray to rule out acute injury.  Otherwise patient is appropriate to discharge with seizure precautions at home and follow-up with her neurologist.  Also sent refills of her Trileptal to pharmacy.  Patient management required discussion with the following services or consulting groups:  None  Complexity of Problems Addressed Acute complicated illness or Injury  Additional Data Reviewed and Analyzed Further history obtained from: Past medical history and medications listed in the EMR and Prior ED visit notes  Patient Encounter Risk Assessment Prescriptions         Final Clinical Impression(s) / ED Diagnoses Final diagnoses:  Seizure (HCC)    Rx / DC Orders ED Discharge Orders          Ordered    oxcarbazepine (TRILEPTAL) 600 MG tablet  2 times daily       Note to Pharmacy: Patient transferring to Dr. Teresa Coombs at [facility] in Feb   08/26/23 1449               Gareth Eagle, PA-C 08/26/23 1522    Coral Spikes, DO 08/26/23 1606

## 2023-08-26 NOTE — ED Notes (Signed)
 CCMD notified via telephone.

## 2023-08-26 NOTE — Discharge Instructions (Addendum)
Mary Salinas:  Thank you for allowing [place] to take care of you today.  We hope you begin feeling better soon.  To-Do: Please follow-up with your neurologist as soon as possible.  Call them if you need to leave the hospital to schedule an appointment. Please return to the Emergency Department or call 911 if you experience chest pain, shortness of breath, severe pain, severe fever, altered mental status, or have any reason to think that you need emergency medical care.  Thank you again.  Hope you feel better soon.  Department of Emergency Medicine [facility]

## 2023-08-26 NOTE — ED Triage Notes (Signed)
Patient with hx of seizure from school via EMS after having a seizure. No one witnessed the seizure. She was on the phone with her mother who called EMS and stated it seemed to be a normal seizure for her. Patient post-ictal and on the floor when EMS got there but now A/O x 4. C collar applied by EMS. Dried blood noted to nostrils and tongue trauma noted as well. Takes Keppra and Trileptal and states she has been out of Trileptal x 2 days and cannot receive another prescription for several more days. She has continued to take the Keppra as prescribed.

## 2023-08-26 NOTE — Progress Notes (Signed)
Orthopedic Tech Progress Note Patient Details:  Mary Salinas 2006-05-05 [id number]  Ortho Devices Type of Ortho Device: Finger splint Ortho Device/Splint Location: R thumb Ortho Device/Splint Interventions: Ordered, Application   Post Interventions Patient Tolerated: Well Instructions Provided: Adjustment of device, Care of device  Tonye Pearson 08/26/2023, 4:31 PM

## 2023-08-30 ENCOUNTER — Encounter: Payer: Self-pay | Admitting: Neurology

## 2023-08-30 ENCOUNTER — Ambulatory Visit (INDEPENDENT_AMBULATORY_CARE_PROVIDER_SITE_OTHER): Payer: Medicaid Other | Admitting: Neurology

## 2023-08-30 VITALS — BP 137/88 | HR 90 | Ht 60.0 in | Wt 151.0 lb

## 2023-08-30 DIAGNOSIS — Z5181 Encounter for therapeutic drug level monitoring: Secondary | ICD-10-CM | POA: Diagnosis not present

## 2023-08-30 DIAGNOSIS — G40209 Localization-related (focal) (partial) symptomatic epilepsy and epileptic syndromes with complex partial seizures, not intractable, without status epilepticus: Secondary | ICD-10-CM | POA: Diagnosis not present

## 2023-08-30 DIAGNOSIS — C719 Malignant neoplasm of brain, unspecified: Secondary | ICD-10-CM | POA: Diagnosis not present

## 2023-08-30 DIAGNOSIS — R110 Nausea: Secondary | ICD-10-CM

## 2023-08-30 MED ORDER — OXCARBAZEPINE 600 MG PO TABS: 900.0000 mg | ORAL_TABLET | Freq: Two times a day (BID) | ORAL | 3 refills | Status: DC

## 2023-08-30 MED ORDER — NAYZILAM 5 MG/0.1ML NA SOLN: 5.0000 mg | NASAL | 3 refills | Status: AC | PRN

## 2023-08-30 MED ORDER — NAYZILAM 5 MG/0.1ML NA SOLN: 5.0000 mg | NASAL | 3 refills | Status: DC | PRN

## 2023-08-30 MED ORDER — ONDANSETRON 4 MG PO TBDP: 4.0000 mg | ORAL_TABLET | Freq: Three times a day (TID) | ORAL | 0 refills | Status: AC | PRN

## 2023-08-30 NOTE — Patient Instructions (Addendum)
 Continue levetiracetam 1500 mg twice daily Continue with oxcarbazepine 900 mg twice daily Will check levels today with CMP and CBC Zofran as needed for nausea Continue to follow-up PCP Please contact me if you do have a breakthrough seizure Follow-up in 3 months or sooner if worse.

## 2023-08-30 NOTE — Progress Notes (Signed)
 [facility]  PATIENT: Mary Salinas DOB: [birth year]  REQUESTING CLINICIAN: Lezlie Lye, MD HISTORY FROM: Patient  REASON FOR VISIT: Patient/Parent and chart review    HISTORICAL  CHIEF COMPLAINT:  Chief Complaint  Patient presents with   New Patient (Initial Visit)    Pt in 12, here with mother Wynn Banker, father Onalee Hua  Pt is here referred by ER for epilepsy. Pt states she takes Keppra for seizures.     HISTORY OF PRESENT ILLNESS:  This 18 year old woman with history of low-grade glioma, epilepsy who is presenting to establish care for epilepsy.  Patient was previously manage by the pediatric epilepsy group, but since turning 16 she needs a new adult neurologist. In brief, she tells me that her first seizure occurred in 2019, described as generalized convulsion.  She believes she fell of the bed with foaming at the mouth and tongue biting.  During the workup she was found to have a mass in the left posterior temporal lobe, after biopsy diagnosed with low-grade glioma.  She has been on levetiracetam and oxcarbazepine, but was having breakthrough seizure so the medications were increased to 1500 mg of levetiracetam twice daily and 900 mg of oxcarbazepine twice daily.  The last change was done in March 2024.  In August 26, 2023 she did have a breakthrough seizure due to not having oxcarbazepine for about 3 days.  She was at school, she called her mother and told her that she felt like she had a seizure because he had a tongue biting and also hurt her finger.  Mother called the campus police and she was taken to the hospital.  At discharge her oxcarbazepine was restarted.  She tells me for the past 2 days she has been having nausea, denies any diarrhea.  She is compliant with the medications. She is back in school studying kinesiology.    Handedness: Right handed   Onset: 2019  Seizure Type:  1.Staring spells with contraction of hands  2. Convulsion   Current  frequency: Last seizure on   Any injuries from seizures: Tongue biting, broken finger  Seizure risk factors: Low grade glioma   Previous ASMs: Levetiracetam, Oxcarbazepine   Currenty ASMs: Levetiracetam 1500 mg BID, Oxcarbazepine 900 mg BID  ASMs side effects: Denies   Brain Images: Low grade glioma left mesial and posterior temporal lobe  Previous EEGs: Epileptiform discharges in the left frontotemporal region     Previous history from Pediatric neurology group Mary Salinas is a 18 y.o. 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  The patient is accompanied by her mother for today's visit.  The patient states that she has rare seizures since last visit.  She was brought to the emergency department because her friend did not know her condition when she had her typical complex partial seizure (loss of awareness, staring into space, unresponsive) on February 19, 2023.  Her seizure may last few seconds and then the patient went back to sleep.  It happened 2 times that day for which her friend called EMS and was transported to the emergency department.  Patient had repeated MRI brain with and without contrast reported infiltrative, masslike T2 signal in the medial aspect of the left occipital lobe with central area of cystic change, slightly increased in the extent since 2021.  No abnormal enhancement.  The patient already had repeated MRI brain in Januar 18, 2024 ordered by neurosurgery reported unchanged appearance of a  biopsy-proven low-grade glioma in the left temporal occipital region.  No evidence of progressive neoplasm.  No acute intracranial abnormality.   The patient is compliant taking Keppra 1500 mg twice a day and oxcarbazepine (Trileptal) 900 mg twice a day.  Keppra trough level was 12.4 and oxcarbazepine trough level was 30 on August 17,024.Her mother states that overall she does not have her big seizures and  rarely partial seizures.   Follow-up 12/30/2022: [name] was last seen in child neurology clinic on 09/29/2022.  Her Keppra dose was increased to 1500 mg twice a day and oxcarbazepine 900 mg twice a day from last visit.  The patient said that she left college camps in April 2024 and returned back to [place].  She is staying with her mother for now.  She is working at [employer] and [employer].  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  The patient has no problems taking orders.   The patient reported that she was told that she had 1 or 2 episodes of transient loss of awareness.  The first seizure happened at work earlier this month, she was told by coworker that she zoned out (staring into space and was unresponsive to verbal or finger snapping in front of her face) at work for approximately <2 minutes.  Earlier that day, the patient said she was sleeping and forgot to go to work.  She woke up and rushed out of the bed and went to work.  She said it was a busy day at work and felt tired.  The next episode happened at her mom's house, the patient does not remember what happened but her brother said that she was confused, staring off and unresponsive.  They had to put her in the bed to go back to sleep.  Again, this happened in the morning as well.  The patient had to cut back on her hours of work because of this 2 episodes of likely seizure.  The patient states that she has been taking her antiseizure medication as prescribed and never missed any doses.   Follow-up 09/29/2022: She has been taking Keppra 1250 mg BID and oxcarbazepine 600 mg BID. Antiseizure medication trough levels checked on 01/28/2022. Keppra level was 17.8 and oxcarbazepine was 23.4 both were therapeutic.    She is in college at [employer] and doing well in school. She reported zooning out that occur randomly probably 1-2 times a month. zooning out may varies in  duration but mostly less than 3 minutes in duration. On August 25, 2022, Jayda said that she woke up early to get ready for her classes. However, she missed the class that day. A girl who shares the room with her in the dorm witnessed that [place] dropped stuff from her hands, then fell on the ground and zooned out. the girl called her name and waved her hands in front of her face, but Humphrey Rolls was unresponsive. Humphrey Rolls said that she stood up and went back to sleep and does not have any memory about what happened.    She was told that she was zooning out and unresponsive on March 3rd which could be her last seizure. Humphrey Rolls said that she never missed taking Keppra or oxcarbazepine.     She was seen by neurosurgery on July 25, 2022.As part of her workup an MRI scan was obtained that demonstrated a left occipital brain lesion. 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 She denies any difficulty with her vision but was not able to undergo formal visual field testing with an ophthalmologist. She has a new MRI scan that demonstrates no significant change in the left occipital temporal brain lesion beyond her postoperative changes. She was evaluated by ophthalmology for visual field on 09/04/2022. + right homonymous superior quadrantanopia. The patient reported that she had IUD in March 2024 to prevent pregnancy.   Follow up 01/21/2022:  She that was seen on child neurology clinic for follow-up in February 2023.  She then had no seizures since last visit until January 10, 2022.  Had 1 seizure witnessed by by her sister who heard the sound from her sister.  Humphrey Rolls was sitting on the toilet, suddenly was about to fall from toilet seat as she was staring off, right arm raised up and followed with stiffness of both arms associated with loss of consciousness but no urinary or bowel incontinence.  Seizure lasted approximately 2 minutes.  Postictally,  she was confused.  Denied head trauma or injury and no illness.  She was brought to the emergency room at [facility] and was back to baseline after few hours.  Humphrey Rolls states that she missed Keppra and Trileptal the day before because her mother was unable to refill her prescription as pharmacy had already closed yesterday.  She is taking and tolerating Keppra 1250 mg twice a day and Trileptal 600 mg twice a day, with no side effects.  She reports sleeping throughout the night and maintaining her hydration well.  Last menstrual period January 14, 2022.   On September 07, 2021; Everette had postoperative follow-up status post biopsy of left occipital brain lesion (low-grade astrocytoma) with neurosurgery.  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  The plan is to add her to tumor board to discuss treatment option and add her to pediatric neuro-oncology clinic.  Tekla missed an appointment for MRI brain with and without contrast in June 2023.  She was scheduled for pediatric oncology in May 20, 2022 and also has a follow-up with neurosurgery in November same day.   Epilepsy/seizure History:  Age at seizure onset: 02/10/2018 Description of all seizure types and duration: Semiology #1 falls to the floor, rolling of the eyes, biting, jerking of every part of body 3 min in duration Semiology #2 zone out, staring, hands go to the middle and "lock up"    Complications from seizures (trauma, etc.): No h/o status epilepticus: No   Date of most recent seizure: October 2024 She experienced 1 seizure in 2021, 1 seizure in April 2022, 2 seizures in September 2022, 4 seizures in October, 2022, and 2 seizures so far in November 2022 and last seizure in July 2023 in setting of missing antiseizure medications. In June 2024; the patient had 2 episodes of transient loss of awareness lasting 2 minutes in duration.   Current AEDs: Keppra 1500 twice a day Trileptal 900 mg twice a  day Current side effects: None   Prior AEDs (d/c reason?): None Adherence Estimate: [x ] good   Previous work up:  MRI brain with and without contrast 05/20/2021 unchanged appearance of hyperintense T2-weighted signal lesion abutting the atrium of the left lateral ventricle, consistent with glioma. MRI scan of the brain completed on (03/23/2018) shows evidence of a 3 x 2 x 2 cm lesion in the left mesial posterior temporal lobe, possibly  involving the inferior parietal and a portion of the occipital lobe as well.  This shows increased signal in FLAIR and T2 decreased signal in T1 and no evidence of enhancement.  There does not appear to be any mass effect associated with it.  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 MRI scans without and with contrast performed 07/07/2018, 01/20/2019, and 04/01/2020 were unchanged. Findings most compatible with stable low-grade glioma.      Normal EEG awake, drowsy, and asleep (02/20/2018).   EEG (05/13/2021) :Routine video EEG was abnormal in wakefulness and sleep due to frequent focal epileptiform discharges in the left fronto-temporal regions.   OTHER MEDICAL CONDITIONS: Low grade glioma, Epilepsy   REVIEW OF SYSTEMS: Full 14 system review of systems performed and negative with exception of: As noted in the HPI   ALLERGIES: No Known Allergies  HOME MEDICATIONS: Outpatient Medications Prior to Visit  Medication Sig Dispense Refill   levETIRAcetam (KEPPRA) 750 MG tablet TAKE 2 TABLETS (1,500 MG TOTAL) BY MOUTH 2 (TWO) TIMES DAILY. 360 tablet 1   oxcarbazepine (TRILEPTAL) 600 MG tablet Take 1.5 tablets (900 mg total) by mouth 2 (two) times daily. 90 tablet 0   NAYZILAM 5 MG/0.1ML SOLN Place 5 mg into the nose as needed (place 1 spary into one nostril if seizures lasted longer than2- 3 minutes, may repeat a second dose after 10 minutes if still seizing.). (Patient not taking: Reported on 09/29/2022) 2 each 3   oxcarbazepine (TRILEPTAL) 600  MG tablet Take 1.5 tablets (900 mg total) by mouth 2 (two) times daily. (Patient not taking: Reported on 08/30/2023) 90 tablet 0   No facility-administered medications prior to visit.    PAST MEDICAL HISTORY: Past Medical History:  Diagnosis Date   Brain tumor (HCC)    Seizures (HCC)    Twin birth     PAST SURGICAL HISTORY: Past Surgical History:  Procedure Laterality Date   BRAIN BIOPSY     Jan/2024    FAMILY HISTORY: Family History  Problem Relation Age of Onset   Stroke Maternal Grandfather     SOCIAL HISTORY: Social History   Socioeconomic History   Marital status: Single    Spouse name: Not on file   Number of children: Not on file   Years of education: Not on file   Highest education level: Not on file  Occupational History   Not on file  Tobacco Use   Smoking status: Never   Smokeless tobacco: Never   Tobacco comments:    Patient smoke marijuana . Not ready to quit.   Vaping Use   Vaping status: Every Day  Substance and Sexual Activity   Alcohol use: Not on file   Drug use: Yes    Types: Marijuana   Sexual activity: Not on file  Other Topics Concern   Not on file  Social History Narrative   Maleeyah is a graduated from [employer]   She currently attends [employer].    She lives with mom only. She has three siblings.   She enjoys running track, makeup, and fashion.   Social Drivers of Corporate investment banker Strain: Not on file  Food Insecurity: No Food Insecurity (07/22/2021)   Received from [facility] prior to 09/04/2022., [facility] visits prior to 09/04/2022., [facility]   Hunger Vital Sign    Worried About Running Out of Food in the Last Year: Never true    Ran Out  of Food in the Last Year: Never true  Transportation Needs: Not on file  Physical Activity: Not on file  Stress: Not on file  Social Connections: Not on file  Intimate Partner Violence: Not on file    PHYSICAL  EXAM  GENERAL EXAM/CONSTITUTIONAL: Vitals:  Vitals:   08/30/23 1323  BP: 137/88  Pulse: 90  Weight: 151 lb (68.5 kg)  Height: 5' (1.524 m)   Body mass index is 29.49 kg/m. Wt Readings from Last 3 Encounters:  08/30/23 151 lb (68.5 kg) (81%, Z= 0.87)*  05/17/23 160 lb 7.9 oz (72.8 kg) (88%, Z= 1.16)*  02/19/23 159 lb 2.8 oz (72.2 kg) (87%, Z= 1.14)*   * Growth percentiles are based on CDC (Girls, 2-20 Years) data.   Patient is in no distress; well developed, nourished and groomed; neck is supple  MUSCULOSKELETAL: Gait, strength, tone, movements noted in Neurologic exam below  NEUROLOGIC: MENTAL STATUS:      No data to display         awake, alert, oriented to person, place and time recent and remote memory intact normal attention and concentration language fluent, comprehension intact, naming intact fund of knowledge appropriate  CRANIAL NERVE:  2nd, 3rd, 4th, 6th - Visual fields full to confrontation, extraocular muscles intact, no nystagmus 5th - facial sensation symmetric 7th - facial strength symmetric 8th - hearing intact 9th - palate elevates symmetrically, uvula midline 11th - shoulder shrug symmetric 12th - tongue protrusion midline  MOTOR:  normal bulk and tone, full strength in the BUE, BLE  SENSORY:  normal and symmetric to light touch  COORDINATION:  finger-nose-finger, fine finger movements normal  GAIT/STATION:  normal     DIAGNOSTIC DATA (LABS, IMAGING, TESTING) - I reviewed patient records, labs, notes, testing and imaging myself where available.  Lab Results  Component Value Date   WBC 17.6 (H) 08/26/2023   HGB 13.3 08/26/2023   HCT 42.5 08/26/2023   MCV 88.0 08/26/2023   PLT 368 08/26/2023      Component Value Date/Time   NA 134 (L) 08/26/2023 1155   K 4.5 08/26/2023 1155   CL 104 08/26/2023 1155   CO2 15 (L) 08/26/2023 1155   GLUCOSE 211 (H) 08/26/2023 1155   BUN 8 08/26/2023 1155   CREATININE 1.45 (H) 08/26/2023 1155    CALCIUM 9.0 08/26/2023 1155   PROT 7.7 08/26/2023 1155   ALBUMIN 4.3 08/26/2023 1155   AST 40 08/26/2023 1155   ALT 24 08/26/2023 1155   ALKPHOS 70 08/26/2023 1155   BILITOT 0.5 08/26/2023 1155   GFRNONAA 53 (L) 08/26/2023 1155   GFRAA NOT CALCULATED 02/10/2018 0825   No results found for: "CHOL", "HDL", "LDLCALC", "LDLDIRECT", "TRIG" No results found for: "HGBA1C" No results found for: "VITAMINB12" No results found for: "TSH"  MRI Brain 02/19/2023 Infiltrative, masslike T2 signal in the medial aspect of the left occipital lobe with central area of cystic change, slightly increased in extent since 2021. No abnormal enhancement.  I personally reviewed brain Images   ASSESSMENT AND PLAN  18 y.o. year old female  with history of low-grade glioma, epilepsy who is presenting to establish care.  Unfortunately she did have a breakthrough seizure 4 days ago in the setting of not having her oxcarbazepine for 3 days (Pharmacy could not refill it).  Medication has been restarted.  Plan will be for patient to continue with oxcarbazepine and levetiracetam, and to contact me if she does have a breakthrough seizure.  At that time  we will discuss about adding a third medication versus VNS therapy.  Both patient and family voiced understanding.  I will see him in 3 months for follow-up or sooner if worse.   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 Therapeutic drug monitoring   3. Nausea   4. Glioma [facility])     Patient Instructions  Continue levetiracetam 1500 mg twice daily Continue with oxcarbazepine 900 mg twice daily Will check levels today with CMP and CBC Zofran as needed for nausea Continue to follow-up PCP Please contact me if you do have a breakthrough seizure Follow-up in 3 months or sooner if worse.   Per [facility] statutes, patients with seizures are not allowed to drive until they have been seizure-free for six months.  Other recommendations include  using caution when using heavy equipment or power tools. Avoid working on ladders or at heights. Take showers instead of baths.  Do not swim alone.  Ensure the water temperature is not too high on the home water heater. Do not go swimming alone. Do not lock yourself in a room alone (i.e. bathroom). When caring for infants or small children, sit down when holding, feeding, or changing them to minimize risk of injury to the child in the event you have a seizure. Maintain good sleep hygiene. Avoid alcohol.  Also recommend adequate sleep, hydration, good diet and minimize stress.   During the Seizure  - First, ensure adequate ventilation and place patients on the floor on their left side  Loosen clothing around the neck and ensure the airway is patent. If the patient is clenching the teeth, do not force the mouth open with any object as this can cause severe damage - Remove all items from the surrounding that can be hazardous. The patient may be oblivious to what's happening and may not even know what he or she is doing. If the patient is confused and wandering, either gently guide him/her away and block access to outside areas - Reassure the individual and be comforting - Call 911. In most cases, the seizure ends before EMS arrives. However, there are cases when seizures may last over 3 to 5 minutes. Or the individual may have developed breathing difficulties or severe injuries. If a pregnant patient or a person with diabetes develops a seizure, it is prudent to call an ambulance. - Finally, if the patient does not regain full consciousness, then call EMS. Most patients will remain confused for about 45 to 90 minutes after a seizure, so you must use judgment in calling for help. - Avoid restraints but make sure the patient is in a bed with padded side rails - Place the individual in a lateral position with the neck slightly flexed; this will help the saliva drain from the mouth and prevent the tongue from  falling backward - Remove all nearby furniture and other hazards from the area - Provide verbal assurance as the individual is regaining consciousness - Provide the patient with privacy if possible - Call for help and start treatment as ordered by the caregiver   After the Seizure (Postictal Stage)  After a seizure, most patients experience confusion, fatigue, muscle pain and/or a headache. Thus, one should permit the individual to sleep. For the next few days, reassurance is essential. Being calm and helping reorient the person is also of importance.  Most seizures are painless and end spontaneously. Seizures are not harmful to others but can lead to complications such as stress  on the lungs, brain and the heart. Individuals with prior lung problems may develop labored breathing and respiratory distress.    Discussed Patients with epilepsy have a small risk of sudden unexpected death, a condition referred to as sudden unexpected death in epilepsy (SUDEP). SUDEP is defined specifically as the sudden, unexpected, witnessed or unwitnessed, nontraumatic and nondrowning death in patients with epilepsy with or without evidence for a seizure, and excluding documented status epilepticus, in which post mortem examination does not reveal a structural or toxicologic cause for death     Orders Placed This Encounter  Procedures   10-Hydroxycarbazepine   Levetiracetam level   CBC (no diff)   CMP    Meds ordered this encounter  Medications   ondansetron (ZOFRAN-ODT) 4 MG disintegrating tablet    Sig: Take 1 tablet (4 mg total) by mouth every 8 (eight) hours as needed for nausea or vomiting.    Dispense:  20 tablet    Refill:  0   DISCONTD: NAYZILAM 5 MG/0.1ML SOLN    Sig: Place 5 mg into the nose as needed (place 1 spary into one nostril if seizures lasted longer than2- 3 minutes, may repeat a second dose after 10 minutes if still seizing.).    Dispense:  4 each    Refill:  3    Please provide 4  boxes to patient to take a home, at school and at her sister dorm (same university)   NAYZILAM 5 MG/0.1ML SOLN    Sig: Place 5 mg into the nose as needed (place 1 spary into one nostril if seizures lasted longer than2- 3 minutes, may repeat a second dose after 10 minutes if still seizing.).    Dispense:  4 each    Refill:  3    Please provide 4 boxes to patient to take a home, at school and at her sister dorm (same university)   oxcarbazepine (TRILEPTAL) 600 MG tablet    Sig: Take 1.5 tablets (900 mg total) by mouth 2 (two) times daily.    Dispense:  270 tablet    Refill:  3    Return in about 3 months (around 11/27/2023).    Windell Norfolk, MD 08/30/2023, 3:02 PM  [facility] [address] [place], [place] [address] [phone]

## 2023-09-04 LAB — COMPREHENSIVE METABOLIC PANEL
ALT: 105 IU/L — ABNORMAL HIGH (ref 0–32)
AST: 235 IU/L — ABNORMAL HIGH (ref 0–40)
Albumin: 4.5 g/dL (ref 4.0–5.0)
Alkaline Phosphatase: 83 IU/L (ref 42–106)
BUN/Creatinine Ratio: 8 — ABNORMAL LOW (ref 9–23)
BUN: 18 mg/dL (ref 6–20)
Bilirubin Total: 0.2 mg/dL (ref 0.0–1.2)
CO2: 20 mmol/L (ref 20–29)
Calcium: 9.3 mg/dL (ref 8.7–10.2)
Chloride: 103 mmol/L (ref 96–106)
Creatinine, Ser: 2.14 mg/dL — ABNORMAL HIGH (ref 0.57–1.00)
Globulin, Total: 3 g/dL (ref 1.5–4.5)
Glucose: 93 mg/dL (ref 70–99)
Potassium: 4.7 mmol/L (ref 3.5–5.2)
Sodium: 140 mmol/L (ref 134–144)
Total Protein: 7.5 g/dL (ref 6.0–8.5)
eGFR: 33 mL/min/{1.73_m2} — ABNORMAL LOW (ref >59)

## 2023-09-04 LAB — CBC
Hematocrit: 37.3 % (ref 34.0–46.6)
Hemoglobin: 12.3 g/dL (ref 11.1–15.9)
MCH: 27.6 pg (ref 26.6–33.0)
MCHC: 33 g/dL (ref 31.5–35.7)
MCV: 84 fL (ref 79–97)
Platelets: 340 10*3/uL (ref 150–450)
RBC: 4.46 x10E6/uL (ref 3.77–5.28)
RDW: 13.3 % (ref 11.7–15.4)
WBC: 5.7 10*3/uL (ref 3.4–10.8)

## 2023-09-04 LAB — LEVETIRACETAM LEVEL: Levetiracetam Lvl: 69.6 ug/mL — ABNORMAL HIGH (ref 10.0–40.0)

## 2023-09-04 LAB — 10-HYDROXYCARBAZEPINE: Oxcarbazepine SerPl-Mcnc: 87 ug/mL — ABNORMAL HIGH (ref 10–35)

## 2023-09-06 ENCOUNTER — Encounter: Payer: Self-pay | Admitting: Neurology

## 2023-09-07 NOTE — Telephone Encounter (Signed)
 Pt called to schedule an appt, will call back after speaking with mother.

## 2023-09-09 ENCOUNTER — Other Ambulatory Visit: Payer: Self-pay | Admitting: Neurology

## 2023-09-09 DIAGNOSIS — R7989 Other specified abnormal findings of blood chemistry: Secondary | ICD-10-CM

## 2023-09-09 DIAGNOSIS — Z5181 Encounter for therapeutic drug level monitoring: Secondary | ICD-10-CM

## 2023-09-21 ENCOUNTER — Other Ambulatory Visit (INDEPENDENT_AMBULATORY_CARE_PROVIDER_SITE_OTHER): Payer: Self-pay

## 2023-09-21 DIAGNOSIS — Z5181 Encounter for therapeutic drug level monitoring: Secondary | ICD-10-CM

## 2023-09-21 DIAGNOSIS — Z0289 Encounter for other administrative examinations: Secondary | ICD-10-CM

## 2023-09-21 DIAGNOSIS — R7989 Other specified abnormal findings of blood chemistry: Secondary | ICD-10-CM

## 2023-09-22 ENCOUNTER — Encounter: Payer: Self-pay | Admitting: Neurology

## 2023-09-22 LAB — HEPATIC FUNCTION PANEL
ALT: 16 IU/L (ref 0–32)
AST: 16 IU/L (ref 0–40)
Albumin: 4.4 g/dL (ref 4.0–5.0)
Alkaline Phosphatase: 73 IU/L (ref 42–106)
Bilirubin Total: <0.2 mg/dL (ref 0.0–1.2)
Bilirubin, Direct: <0.08 mg/dL (ref 0.00–0.40)
Total Protein: 6.7 g/dL (ref 6.0–8.5)

## 2023-10-11 ENCOUNTER — Other Ambulatory Visit (INDEPENDENT_AMBULATORY_CARE_PROVIDER_SITE_OTHER): Payer: Self-pay | Admitting: Pediatrics

## 2023-10-11 DIAGNOSIS — G40209 Localization-related (focal) (partial) symptomatic epilepsy and epileptic syndromes with complex partial seizures, not intractable, without status epilepticus: Secondary | ICD-10-CM

## 2023-10-26 ENCOUNTER — Telehealth: Payer: Self-pay | Admitting: Neurology

## 2023-10-26 DIAGNOSIS — C719 Malignant neoplasm of brain, unspecified: Secondary | ICD-10-CM

## 2023-10-26 DIAGNOSIS — G40209 Localization-related (focal) (partial) symptomatic epilepsy and epileptic syndromes with complex partial seizures, not intractable, without status epilepticus: Secondary | ICD-10-CM

## 2023-10-26 MED ORDER — LEVETIRACETAM 750 MG PO TABS: 1500.0000 mg | ORAL_TABLET | Freq: Two times a day (BID) | ORAL | 1 refills | Status: DC

## 2023-10-26 MED ORDER — OXCARBAZEPINE 600 MG PO TABS: 900.0000 mg | ORAL_TABLET | Freq: Two times a day (BID) | ORAL | 3 refills | Status: DC

## 2023-10-26 NOTE — Telephone Encounter (Signed)
 Refilled to [facility] [address] [place], [place] [address] Phone [phone]

## 2023-10-26 NOTE — Telephone Encounter (Signed)
 Pt's mother request refill for levETIRAcetam  (KEPPRA ) 750 MG TABLET and oxcarbazepine  (TRILEPTAL ) 600 MG tablet  Permanent change of Pharmacy [facility] [address] [place], [place] [address] Phone [phone]

## 2023-11-30 ENCOUNTER — Ambulatory Visit: Payer: Medicaid Other | Admitting: Neurology

## 2023-12-02 ENCOUNTER — Ambulatory Visit: Admitting: Neurology

## 2023-12-02 ENCOUNTER — Encounter: Payer: Self-pay | Admitting: Neurology

## 2023-12-02 VITALS — BP 118/81 | HR 94 | Ht 61.0 in | Wt 161.0 lb

## 2023-12-02 DIAGNOSIS — C719 Malignant neoplasm of brain, unspecified: Secondary | ICD-10-CM | POA: Diagnosis not present

## 2023-12-02 DIAGNOSIS — G40209 Localization-related (focal) (partial) symptomatic epilepsy and epileptic syndromes with complex partial seizures, not intractable, without status epilepticus: Secondary | ICD-10-CM

## 2023-12-02 NOTE — Patient Instructions (Signed)
 Continue levetiracetam  1500 mg twice daily Continue with oxcarbazepine  900 mg twice daily Use Nayzilam  as needed for rescue medication Please contact me if you do have a breakthrough seizure. Discussed VNS therapy versus adding a third manage agent such as an overnight Follow-up in 6 months or sooner if worse.

## 2023-12-02 NOTE — Progress Notes (Signed)
 [facility]  PATIENT: Mary Salinas DOB: 03/16/2006  REQUESTING CLINICIAN: No ref. provider found HISTORY FROM: Patient  REASON FOR VISIT: Patient/Parent and chart review    HISTORICAL  CHIEF COMPLAINT:  Chief Complaint  Patient presents with   Follow-up    RM 48 with family. Last seen 08/30/23.  Two episodes of small seizure events (staring spells) since last visit. No LOC.   Had seizure in dorm when moving out 10/27/2023 in morning. EMS wanted to bring her to hospital but mother declined.  Another episode occured 11/20/23 (staring spell). She was taking old dose of keppra  500mg  d/t not being able to fill 750mg  for some reason at the time. Doing ok since events.    INTERVAL HISTORY 12/02/2023:  Patient presents today for follow-up, she is accompanied by her mother and sister.  Last visit was in February since then, she tells me that she did have 2 seizures.  One was on April 24, this was in the setting of running out of her prescription of levetiracetam  750 mg and was using the 500 mg.  She was taking lower than recommended levetiracetam  and did have a breakthrough seizure.  EMS was called but patient did not go to the hospital.  The next seizure was described as a small seizure, staring spell that occurred on May 18.  She tells me she was just come home from a trip and was tired and have a staring spell but felt well afterward.  She did not use her Nayzilam  if both seizures and did not call the office to update us  about these 2 seizures. Other than that, she has been doing fine per patient and sister.    HISTORY OF PRESENT ILLNESS:  This 18 year old woman with history of low-grade glioma, epilepsy who is presenting to establish care for epilepsy.  Patient was previously manage by the pediatric epilepsy group, but since turning 16 she needs a new adult neurologist. In brief, she tells me that her first seizure occurred in 2019, described as generalized convulsion.  She  believes she fell of the bed with foaming at the mouth and tongue biting.  During the workup she was found to have a mass in the left posterior temporal lobe, after biopsy diagnosed with low-grade glioma.  She has been on levetiracetam  and oxcarbazepine , but was having breakthrough seizure so the medications were increased to 1500 mg of levetiracetam  twice daily and 900 mg of oxcarbazepine  twice daily.  The last change was done in March 2024.  In August 26, 2023 she did have a breakthrough seizure due to not having oxcarbazepine  for about 3 days.  She was at school, she called her mother and told her that she felt like she had a seizure because he had a tongue biting and also hurt her finger.  Mother called the campus police and she was taken to the hospital.  At discharge her oxcarbazepine  was restarted.  She tells me for the past 2 days she has been having nausea, denies any diarrhea.  She is compliant with the medications. She is back in school studying kinesiology.    Handedness: Right handed   Onset: 2019  Seizure Type:  1.Staring spells with contraction of hands  2. Convulsion   Current frequency: Last seizure on 11/20/2023 (staring spells)  Any injuries from seizures: Tongue biting, broken finger  Seizure risk factors: Low grade glioma   Previous ASMs: Levetiracetam , Oxcarbazepine    Currenty ASMs: Levetiracetam  1500 mg BID, Oxcarbazepine  900 mg BID  ASMs side effects: Denies   Brain Images: Low grade glioma left mesial and posterior temporal lobe  Previous EEGs: Epileptiform discharges in the left frontotemporal region     Previous history from Pediatric neurology group Mary Salinas is a 18 y.o. 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  The patient is accompanied by her mother for today's visit.  The patient states that she has rare seizures since last visit.  She was brought to the emergency  department because her friend did not know her condition when she had her typical complex partial seizure (loss of awareness, staring into space, unresponsive) on February 19, 2023.  Her seizure may last few seconds and then the patient went back to sleep.  It happened 2 times that day for which her friend called EMS and was transported to the emergency department.  Patient had repeated MRI brain with and without contrast reported infiltrative, masslike T2 signal in the medial aspect of the left occipital lobe with central area of cystic change, slightly increased in the extent since 2021.  No abnormal enhancement.  The patient already had repeated MRI brain in Januar 18, 2024 ordered by neurosurgery reported unchanged appearance of a biopsy-proven low-grade glioma in the left temporal occipital region.  No evidence of progressive neoplasm.  No acute intracranial abnormality.   The patient is compliant taking Keppra  1500 mg twice a day and oxcarbazepine  (Trileptal ) 900 mg twice a day.  Keppra  trough level was 12.4 and oxcarbazepine  trough level was 30 on August 17,024.Her mother states that overall she does not have her big seizures and rarely partial seizures.   Follow-up 12/30/2022: Mary Salinas was last seen in child neurology clinic on 09/29/2022.  Her Keppra  dose was increased to 1500 mg twice a day and oxcarbazepine  900 mg twice a day from last visit.  The patient said that she left college camps in April 2024 and returned back to [place].  She is staying with her mother for now.  She is working at [employer] and [employer].  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  The patient has no problems taking orders.   The patient reported that she was told that she had 1 or 2 episodes of transient loss of awareness.  The first seizure happened at work earlier this month, she was told by coworker that she zoned out (staring into space  and was unresponsive to verbal or finger snapping in front of her face) at work for approximately <2 minutes.  Earlier that day, the patient said she was sleeping and forgot to go to work.  She woke up and rushed out of the bed and went to work.  She said it was a busy day at work and felt tired.  The next episode happened at her mom's house, the patient does not remember what happened but her brother said that she was confused, staring off and unresponsive.  They had to put her in the bed to go back to sleep.  Again, this happened in the morning as well.  The patient had to cut back on her hours of work because of this 2 episodes of likely seizure.  The patient states that she has been taking her antiseizure medication as prescribed and never missed any doses.   Follow-up 09/29/2022: She has been taking Keppra  1250 mg BID and oxcarbazepine  600 mg BID.  Antiseizure medication trough levels checked on 01/28/2022. Keppra  level was 17.8 and oxcarbazepine  was 23.4 both were therapeutic.    She is in college at [employer] and doing well in school. She reported zooning out that occur randomly probably 1-2 times a month. zooning out may varies in duration but mostly less than 3 minutes in duration. On August 25, 2022, Jayda said that she woke up early to get ready for her classes. However, she missed the class that day. A girl who shares the room with her in the dorm witnessed that Jayda dropped stuff from her hands, then fell on the ground and zooned out. the girl called her name and waved her hands in front of her face, but Jayda was unresponsive. Jayda said that she stood up and went back to sleep and does not have any memory about what happened.    She was told that she was zooning out and unresponsive on March 3rd which could be her last seizure. Jayda said that she never missed taking Keppra  or oxcarbazepine .     She was seen by neurosurgery on July 25, 2022.As part of her workup an MRI scan was obtained that  demonstrated a left occipital brain lesion. 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 She denies any difficulty with her vision but was not able to undergo formal visual field testing with an ophthalmologist. She has a new MRI scan that demonstrates no significant change in the left occipital temporal brain lesion beyond her postoperative changes. She was evaluated by ophthalmology for visual field on 09/04/2022. + right homonymous superior quadrantanopia. The patient reported that she had IUD in March 2024 to prevent pregnancy.   Follow up 01/21/2022:  She that was seen on child neurology clinic for follow-up in February 2023.  She then had no seizures since last visit until January 10, 2022.  Had 1 seizure witnessed by by her sister who heard the sound from her sister.  Jayda was sitting on the toilet, suddenly was about to fall from toilet seat as she was staring off, right arm raised up and followed with stiffness of both arms associated with loss of consciousness but no urinary or bowel incontinence.  Seizure lasted approximately 2 minutes.  Postictally, she was confused.  Denied head trauma or injury and no illness.  She was brought to the emergency room at [facility] and was back to baseline after few hours.  Amiel Kalata states that she missed Keppra  and Trileptal  the day before because her mother was unable to refill her prescription as pharmacy had already closed yesterday.  She is taking and tolerating Keppra  1250 mg twice a day and Trileptal  600 mg twice a day, with no side effects.  She reports sleeping throughout the night and maintaining her hydration well.  Last menstrual period January 14, 2022.   On September 07, 2021; Markell had postoperative follow-up status post biopsy of left occipital brain lesion (low-grade astrocytoma) with neurosurgery.  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   The plan is to add her to tumor board to discuss treatment option and add her to pediatric neuro-oncology clinic.  Addalee missed an appointment for MRI brain with and without contrast in June 2023.  She was scheduled for pediatric oncology in May 20, 2022 and also has a follow-up with neurosurgery in November same day.   Epilepsy/seizure History:  Age at  seizure onset: 02/10/2018 Description of all seizure types and duration: Semiology #1 falls to the floor, rolling of the eyes, biting, jerking of every part of body 3 min in duration Semiology #2 zone out, staring, hands go to the middle and "lock up"    Complications from seizures (trauma, etc.): No h/o status epilepticus: No   Date of most recent seizure: October 2024 She experienced 1 seizure in 2021, 1 seizure in April 2022, 2 seizures in September 2022, 4 seizures in October, 2022, and 2 seizures so far in November 2022 and last seizure in July 2023 in setting of missing antiseizure medications. In June 2024; the patient had 2 episodes of transient loss of awareness lasting 2 minutes in duration.   Current AEDs: Keppra  1500 twice a day Trileptal  900 mg twice a day Current side effects: None   Prior AEDs (d/c reason?): None Adherence Estimate: [x ] good   Previous work up:  MRI brain with and without contrast 05/20/2021 unchanged appearance of hyperintense T2-weighted signal lesion abutting the atrium of the left lateral ventricle, consistent with glioma. MRI scan of the brain completed on (03/23/2018) shows evidence of a 3 x 2 x 2 cm lesion in the left mesial posterior temporal lobe, possibly involving the inferior parietal and a portion of the occipital lobe as well.  This shows increased signal in FLAIR and T2 decreased signal in T1 and no evidence of enhancement.  There does not appear to be any mass effect associated with it.  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 MRI scans without and with contrast performed  07/07/2018, 01/20/2019, and 04/01/2020 were unchanged. Findings most compatible with stable low-grade glioma.      Normal EEG awake, drowsy, and asleep (02/20/2018).   EEG (05/13/2021) :Routine video EEG was abnormal in wakefulness and sleep due to frequent focal epileptiform discharges in the left fronto-temporal regions.   OTHER MEDICAL CONDITIONS: Low grade glioma, Epilepsy   REVIEW OF SYSTEMS: Full 14 system review of systems performed and negative with exception of: As noted in the HPI   ALLERGIES: No Known Allergies  HOME MEDICATIONS: Outpatient Medications Prior to Visit  Medication Sig Dispense Refill   levETIRAcetam  (KEPPRA ) 750 MG tablet Take 2 tablets (1,500 mg total) by mouth 2 (two) times daily. 360 tablet 1   NAYZILAM  5 MG/0.1ML SOLN Place 5 mg into the nose as needed (place 1 spary into one nostril if seizures lasted longer than2- 3 minutes, may repeat a second dose after 10 minutes if still seizing.). 4 each 3   ondansetron  (ZOFRAN -ODT) 4 MG disintegrating tablet Take 1 tablet (4 mg total) by mouth every 8 (eight) hours as needed for nausea or vomiting. 20 tablet 0   oxcarbazepine  (TRILEPTAL ) 600 MG tablet Take 1.5 tablets (900 mg total) by mouth 2 (two) times daily. 270 tablet 3   No facility-administered medications prior to visit.    PAST MEDICAL HISTORY: Past Medical History:  Diagnosis Date   Brain tumor (HCC)    Seizures (HCC)    Twin birth     PAST SURGICAL HISTORY: Past Surgical History:  Procedure Laterality Date   BRAIN BIOPSY     Jan/2024    FAMILY HISTORY: Family History  Problem Relation Age of Onset   Stroke Maternal Grandfather     SOCIAL HISTORY: Social History   Socioeconomic History   Marital status: Single    Spouse name: Not on file   Number of children: Not on file  Years of education: Not on file   Highest education level: Not on file  Occupational History   Not on file  Tobacco Use   Smoking status: Never   Smokeless  tobacco: Never   Tobacco comments:    Patient smoke marijuana . Not ready to quit.   Vaping Use   Vaping status: Every Day  Substance and Sexual Activity   Alcohol use: Not on file   Drug use: Yes    Types: Marijuana   Sexual activity: Not on file  Other Topics Concern   Not on file  Social History Narrative   Blanche is a graduated from [employer]   She currently attends [employer].    She lives with mom only. She has three siblings.   She enjoys running track, makeup, and fashion.   Social Drivers of Corporate investment banker Strain: Not on file  Food Insecurity: No Food Insecurity (07/22/2021)   Received from [facility] visits prior to 09/04/2022., [facility] visits prior to 09/04/2022., [facility]   Hunger Vital Sign    Worried About Programme researcher, broadcasting/film/video in the Last Year: Never true    Ran Out of Food in the Last Year: Never true  Transportation Needs: Not on file  Physical Activity: Not on file  Stress: Not on file  Social Connections: Not on file  Intimate Partner Violence: Not on file    PHYSICAL EXAM  GENERAL EXAM/CONSTITUTIONAL: Vitals:  Vitals:   12/02/23 1032  BP: 118/81  Pulse: 94  Weight: 161 lb (73 kg)  Height: 5\' 1"  (1.549 m)   Body mass index is 30.42 kg/m. Wt Readings from Last 3 Encounters:  12/02/23 161 lb (73 kg)  08/30/23 151 lb (68.5 kg) (81%, Z= 0.87)*  05/17/23 160 lb 7.9 oz (72.8 kg) (88%, Z= 1.16)*   * Growth percentiles are based on CDC (Girls, 2-20 Years) data.   Patient is in no distress; well developed, nourished and groomed; neck is supple  MUSCULOSKELETAL: Gait, strength, tone, movements noted in Neurologic exam below  NEUROLOGIC: MENTAL STATUS:      No data to display         awake, alert, oriented to person, place and time recent and remote memory intact normal attention and concentration language fluent, comprehension intact, naming intact fund of knowledge  appropriate  CRANIAL NERVE:  2nd, 3rd, 4th, 6th - Visual fields full to confrontation, extraocular muscles intact, no nystagmus 5th - facial sensation symmetric 7th - facial strength symmetric 8th - hearing intact 9th - palate elevates symmetrically, uvula midline 11th - shoulder shrug symmetric 12th - tongue protrusion midline  MOTOR:  normal bulk and tone, full strength in the BUE, BLE  SENSORY:  normal and symmetric to light touch  COORDINATION:  finger-nose-finger, fine finger movements normal  GAIT/STATION:  normal     DIAGNOSTIC DATA (LABS, IMAGING, TESTING) - I reviewed patient records, labs, notes, testing and imaging myself where available.  Lab Results  Component Value Date   WBC 5.7 08/30/2023   HGB 12.3 08/30/2023   HCT 37.3 08/30/2023   MCV 84 08/30/2023   PLT 340 08/30/2023      Component Value Date/Time   NA 140 08/30/2023 1400   K 4.7 08/30/2023 1400   CL 103 08/30/2023 1400   CO2 20 08/30/2023 1400   GLUCOSE 93 08/30/2023 1400   GLUCOSE 211 (H) 08/26/2023 1155   BUN 18 08/30/2023 1400  CREATININE 2.14 (H) 08/30/2023 1400   CALCIUM 9.3 08/30/2023 1400   PROT 6.7 09/21/2023 1622   ALBUMIN 4.4 09/21/2023 1622   AST 16 09/21/2023 1622   ALT 16 09/21/2023 1622   ALKPHOS 73 09/21/2023 1622   BILITOT <0.2 09/21/2023 1622   GFRNONAA 53 (L) 08/26/2023 1155   GFRAA NOT CALCULATED 02/10/2018 0825   No results found for: "CHOL", "HDL", "LDLCALC", "LDLDIRECT", "TRIG" No results found for: "HGBA1C" No results found for: "VITAMINB12" No results found for: "TSH"  MRI Brain 02/19/2023 Infiltrative, masslike T2 signal in the medial aspect of the left occipital lobe with central area of cystic change, slightly increased in extent since 2021. No abnormal enhancement.  I personally reviewed brain Images   ASSESSMENT AND PLAN  18 y.o. year old female  with history of low-grade glioma, epilepsy who is presenting for follow up.  She did have 2  breakthrough seizures, 1 in the setting of taking lower than recommended levetiracetam  and another one in the setting of tiredness.  She does report compliance with levetiracetam  1500 mg twice daily and oxcarbazepine  900 mg twice daily.  Plan will be to continue patient on both medications but if she does have another seizure, we will start a third agent, likely cenobamate.  We also discussed VNS therapy.  She will learn more about the procedure and we will continue to discuss it at next visit.  I will see her in 6 months for follow-up but patient understands to contact me if she does have a breakthrough seizure.  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 Glioma (HCC)      Patient Instructions  Continue levetiracetam  1500 mg twice daily Continue with oxcarbazepine  900 mg twice daily Use Nayzilam  as needed for rescue medication Please contact me if you do have a breakthrough seizure. Discussed VNS therapy versus adding a third manage agent such as an overnight Follow-up in 6 months or sooner if worse.   Per [place]  DMV statutes, patients with seizures are not allowed to drive until they have been seizure-free for six months.  Other recommendations include using caution when using heavy equipment or power tools. Avoid working on ladders or at heights. Take showers instead of baths.  Do not swim alone.  Ensure the water temperature is not too high on the home water heater. Do not go swimming alone. Do not lock yourself in a room alone (i.e. bathroom). When caring for infants or small children, sit down when holding, feeding, or changing them to minimize risk of injury to the child in the event you have a seizure. Maintain good sleep hygiene. Avoid alcohol.  Also recommend adequate sleep, hydration, good diet and minimize stress.   During the Seizure  - First, ensure adequate ventilation and place patients on the floor on their left side  Loosen clothing around the neck  and ensure the airway is patent. If the patient is clenching the teeth, do not force the mouth open with any object as this can cause severe damage - Remove all items from the surrounding that can be hazardous. The patient may be oblivious to what's happening and may not even know what he or she is doing. If the patient is confused and wandering, either gently guide him/her away and block access to outside areas - Reassure the individual and be comforting - Call 911. In most cases, the seizure ends before EMS arrives. However, there are cases when seizures may last over 3 to  5 minutes. Or the individual may have developed breathing difficulties or severe injuries. If a pregnant patient or a person with diabetes develops a seizure, it is prudent to call an ambulance. - Finally, if the patient does not regain full consciousness, then call EMS. Most patients will remain confused for about 45 to 90 minutes after a seizure, so you must use judgment in calling for help. - Avoid restraints but make sure the patient is in a bed with padded side rails - Place the individual in a lateral position with the neck slightly flexed; this will help the saliva drain from the mouth and prevent the tongue from falling backward - Remove all nearby furniture and other hazards from the area - Provide verbal assurance as the individual is regaining consciousness - Provide the patient with privacy if possible - Call for help and start treatment as ordered by the caregiver   After the Seizure (Postictal Stage)  After a seizure, most patients experience confusion, fatigue, muscle pain and/or a headache. Thus, one should permit the individual to sleep. For the next few days, reassurance is essential. Being calm and helping reorient the person is also of importance.  Most seizures are painless and end spontaneously. Seizures are not harmful to others but can lead to complications such as stress on the lungs, brain and the  heart. Individuals with prior lung problems may develop labored breathing and respiratory distress.    Discussed Patients with epilepsy have a small risk of sudden unexpected death, a condition referred to as sudden unexpected death in epilepsy (SUDEP). SUDEP is defined specifically as the sudden, unexpected, witnessed or unwitnessed, nontraumatic and nondrowning death in patients with epilepsy with or without evidence for a seizure, and excluding documented status epilepticus, in which post mortem examination does not reveal a structural or toxicologic cause for death     No orders of the defined types were placed in this encounter.   No orders of the defined types were placed in this encounter.   Return in about 6 months (around 06/03/2024).   The patient's condition requires frequent monitoring and adjustments in the treatment plan, reflecting the ongoing complexity of care.  This provider is the continuing focal point for all needed services for this condition.   Cassandra Cleveland, MD 12/02/2023, 11:19 AM  [facility] [address], [address] [place], [place] [address] [phone]

## 2024-01-26 ENCOUNTER — Other Ambulatory Visit: Payer: Self-pay | Admitting: Neurology

## 2024-01-26 DIAGNOSIS — G40209 Localization-related (focal) (partial) symptomatic epilepsy and epileptic syndromes with complex partial seizures, not intractable, without status epilepticus: Secondary | ICD-10-CM

## 2024-04-25 ENCOUNTER — Other Ambulatory Visit: Payer: Self-pay | Admitting: Neurology

## 2024-04-25 DIAGNOSIS — G40209 Localization-related (focal) (partial) symptomatic epilepsy and epileptic syndromes with complex partial seizures, not intractable, without status epilepticus: Secondary | ICD-10-CM

## 2024-04-26 NOTE — Telephone Encounter (Signed)
 Last seen on 12/02/23 Follow up scheduled on 07/09/24

## 2024-07-09 ENCOUNTER — Ambulatory Visit (INDEPENDENT_AMBULATORY_CARE_PROVIDER_SITE_OTHER): Admitting: Neurology

## 2024-07-09 ENCOUNTER — Encounter: Payer: Self-pay | Admitting: Neurology

## 2024-07-09 DIAGNOSIS — C719 Malignant neoplasm of brain, unspecified: Secondary | ICD-10-CM

## 2024-07-09 DIAGNOSIS — G40209 Localization-related (focal) (partial) symptomatic epilepsy and epileptic syndromes with complex partial seizures, not intractable, without status epilepticus: Secondary | ICD-10-CM | POA: Diagnosis not present

## 2024-07-09 MED ORDER — OXCARBAZEPINE 600 MG PO TABS: 900.0000 mg | ORAL_TABLET | Freq: Two times a day (BID) | ORAL | 3 refills | Status: AC

## 2024-07-09 MED ORDER — LEVETIRACETAM 750 MG PO TABS: 1500.0000 mg | ORAL_TABLET | Freq: Two times a day (BID) | ORAL | 3 refills | Status: AC

## 2024-07-09 NOTE — Progress Notes (Signed)
 "  [facility]  PATIENT: Mary Salinas DOB: 08/05/2005  REQUESTING CLINICIAN: No ref. provider found HISTORY FROM: Patient  REASON FOR VISIT: Patient/Parent and chart review    HISTORICAL  CHIEF COMPLAINT:  Chief Complaint  Patient presents with   Follow-up    Room 12 With mother and sister 6 month follow up sz. Last sz- 3 months    INTERVAL HISTORY 07/10/2023 Patient presents today for follow-up, she is accompanied by her mother and sister, last visit was in May 2025, since then she tells me that she did have 1 or 2 seizures.  Does not remember exactly what happened, but she is confident she has not had a seizure in the past 3 months.  She reports compliance with Keppra  1500 mg twice daily and oxcarbazepine  900 mg twice daily.  Denies any side effect from her medication.  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 She tells me that she has not seen her neurosurgeon at [facility] lately.   INTERVAL HISTORY 12/02/2023:  Patient presents today for follow-up, she is accompanied by her mother and sister.  Last visit was in February since then, she tells me that she did have 2 seizures.  One was on April 24, this was in the setting of running out of her prescription of levetiracetam  750 mg and was using the 500 mg.  She was taking lower than recommended levetiracetam  and did have a breakthrough seizure.  EMS was called but patient did not go to the hospital.  The next seizure was described as a small seizure, staring spell that occurred on May 18.  She tells me she was just come home from a trip and was tired and have a staring spell but felt well afterward.  She did not use her Nayzilam  if both seizures and did not call the office to update us  about these 2 seizures. Other than that, she has been doing fine per patient and sister.    HISTORY OF PRESENT ILLNESS:  This 19 year old woman with history of low-grade glioma, epilepsy who is presenting to establish care for epilepsy.   Patient was previously manage by the pediatric epilepsy group, but since turning 16 she needs a new adult neurologist. In brief, she tells me that her first seizure occurred in 2019, described as generalized convulsion.  She believes she fell of the bed with foaming at the mouth and tongue biting.  During the workup she was found to have a mass in the left posterior temporal lobe, after biopsy diagnosed with low-grade glioma.  She has been on levetiracetam  and oxcarbazepine , but was having breakthrough seizure so the medications were increased to 1500 mg of levetiracetam  twice daily and 900 mg of oxcarbazepine  twice daily.  The last change was done in March 2024.  In August 26, 2023 she did have a breakthrough seizure due to not having oxcarbazepine  for about 3 days.  She was at school, she called her mother and told her that she felt like she had a seizure because he had a tongue biting and also hurt her finger.  Mother called the campus police and she was taken to the hospital.  At discharge her oxcarbazepine  was restarted.  She tells me for the past 2 days she has been having nausea, denies any diarrhea.  She is compliant with the medications. She is back in school studying kinesiology.    Handedness: Right handed   Onset: 2019  Seizure Type:  1.Staring spells with contraction of hands  2.  Convulsion   Current frequency: Last seizure on 11/20/2023 (staring spells)  Any injuries from seizures: Tongue biting, broken finger  Seizure risk factors: Low grade glioma   Previous ASMs: Levetiracetam , Oxcarbazepine    Currenty ASMs: Levetiracetam  1500 mg BID, Oxcarbazepine  900 mg BID  ASMs side effects: Denies   Brain Images: Low grade glioma left mesial and posterior temporal lobe  Previous EEGs: Epileptiform discharges in the left frontotemporal region     Previous history from Pediatric neurology group Georga Stys is a 19 y.o. 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  The patient is accompanied by her mother for today's visit.  The patient states that she has rare seizures since last visit.  She was brought to the emergency department because her friend did not know her condition when she had her typical complex partial seizure (loss of awareness, staring into space, unresponsive) on February 19, 2023.  Her seizure may last few seconds and then the patient went back to sleep.  It happened 2 times that day for which her friend called EMS and was transported to the emergency department.  Patient had repeated MRI brain with and without contrast reported infiltrative, masslike T2 signal in the medial aspect of the left occipital lobe with central area of cystic change, slightly increased in the extent since 2021.  No abnormal enhancement.  The patient already had repeated MRI brain in Januar 18, 2024 ordered by neurosurgery reported unchanged appearance of a biopsy-proven low-grade glioma in the left temporal occipital region.  No evidence of progressive neoplasm.  No acute intracranial abnormality.   The patient is compliant taking Keppra  1500 mg twice a day and oxcarbazepine  (Trileptal ) 900 mg twice a day.  Keppra  trough level was 12.4 and oxcarbazepine  trough level was 30 on August 17,024.Her mother states that overall she does not have her big seizures and rarely partial seizures.   Follow-up 12/30/2022: Linnell was last seen in child neurology clinic on 09/29/2022.  Her Keppra  dose was increased to 1500 mg twice a day and oxcarbazepine  900 mg twice a day from last visit.  The patient said that she left college camps in April 2024 and returned back to [place].  She is staying with her mother for now.  She is working at [employer] and [employer].  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  The patient has no problems taking  orders.   The patient reported that she was told that she had 1 or 2 episodes of transient loss of awareness.  The first seizure happened at work earlier this month, she was told by coworker that she zoned out (staring into space and was unresponsive to verbal or finger snapping in front of her face) at work for approximately <2 minutes.  Earlier that day, the patient said she was sleeping and forgot to go to work.  She woke up and rushed out of the bed and went to work.  She said it was a busy day at work and felt tired.  The next episode happened at her mom's house, the patient does not remember what happened but her brother said that she was confused, staring off and unresponsive.  They had to put her in the bed to go back to sleep.  Again, this happened in the morning as well.  The patient had to cut back on her hours  of work because of this 2 episodes of likely seizure.  The patient states that she has been taking her antiseizure medication as prescribed and never missed any doses.   Follow-up 09/29/2022: She has been taking Keppra  1250 mg BID and oxcarbazepine  600 mg BID. Antiseizure medication trough levels checked on 01/28/2022. Keppra  level was 17.8 and oxcarbazepine  was 23.4 both were therapeutic.    She is in college at [employer] and doing well in school. She reported zooning out that occur randomly probably 1-2 times a month. zooning out may varies in duration but mostly less than 3 minutes in duration. On August 25, 2022, Jayda said that she woke up early to get ready for her classes. However, she missed the class that day. A girl who shares the room with her in the dorm witnessed that Jayda dropped stuff from her hands, then fell on the ground and zooned out. the girl called her name and waved her hands in front of her face, but Jayda was unresponsive. Jayda said that she stood up and went back to sleep and does not have any memory about what happened.    She was told that she was zooning out and  unresponsive on March 3rd which could be her last seizure. Jayda said that she never missed taking Keppra  or oxcarbazepine .     She was seen by neurosurgery on July 25, 2022.As part of her workup an MRI scan was obtained that demonstrated a left occipital brain lesion. 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 She denies any difficulty with her vision but was not able to undergo formal visual field testing with an ophthalmologist. She has a new MRI scan that demonstrates no significant change in the left occipital temporal brain lesion beyond her postoperative changes. She was evaluated by ophthalmology for visual field on 09/04/2022. + right homonymous superior quadrantanopia. The patient reported that she had IUD in March 2024 to prevent pregnancy.   Follow up 01/21/2022:  She that was seen on child neurology clinic for follow-up in February 2023.  She then had no seizures since last visit until January 10, 2022.  Had 1 seizure witnessed by by her sister who heard the sound from her sister.  Jayda was sitting on the toilet, suddenly was about to fall from toilet seat as she was staring off, right arm raised up and followed with stiffness of both arms associated with loss of consciousness but no urinary or bowel incontinence.  Seizure lasted approximately 2 minutes.  Postictally, she was confused.  Denied head trauma or injury and no illness.  She was brought to the emergency room at [facility] and was back to baseline after few hours.  Ivery states that she missed Keppra  and Trileptal  the day before because her mother was unable to refill her prescription as pharmacy had already closed yesterday.  She is taking and tolerating Keppra  1250 mg twice a day and Trileptal  600 mg twice a day, with no side effects.  She reports sleeping throughout the night and maintaining her hydration well.  Last menstrual period January 14, 2022.   On  September 07, 2021; Kassy had postoperative follow-up status post biopsy of left occipital brain lesion (low-grade astrocytoma) with neurosurgery.  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  The plan is to add her to tumor board to discuss treatment option and add her to pediatric  neuro-oncology clinic.  Talea missed an appointment for MRI brain with and without contrast in June 2023.  She was scheduled for pediatric oncology in May 20, 2022 and also has a follow-up with neurosurgery in November same day.   Epilepsy/seizure History:  Age at seizure onset: 02/10/2018 Description of all seizure types and duration: Semiology #1 falls to the floor, rolling of the eyes, biting, jerking of every part of body 3 min in duration Semiology #2 zone out, staring, hands go to the middle and lock up    Complications from seizures (trauma, etc.): No h/o status epilepticus: No   Date of most recent seizure: October 2024 She experienced 1 seizure in 2021, 1 seizure in April 2022, 2 seizures in September 2022, 4 seizures in October, 2022, and 2 seizures so far in November 2022 and last seizure in July 2023 in setting of missing antiseizure medications. In June 2024; the patient had 2 episodes of transient loss of awareness lasting 2 minutes in duration.   Current AEDs: Keppra  1500 twice a day Trileptal  900 mg twice a day Current side effects: None   Prior AEDs (d/c reason?): None Adherence Estimate: [x ] good   Previous work up:  MRI brain with and without contrast 05/20/2021 unchanged appearance of hyperintense T2-weighted signal lesion abutting the atrium of the left lateral ventricle, consistent with glioma. MRI scan of the brain completed on (03/23/2018) shows evidence of a 3 x 2 x 2 cm lesion in the left mesial posterior temporal lobe, possibly involving the inferior parietal and a portion of the occipital lobe as well.  This shows increased signal in FLAIR  and T2 decreased signal in T1 and no evidence of enhancement.  There does not appear to be any mass effect associated with it.  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 MRI scans without and with contrast performed 07/07/2018, 01/20/2019, and 04/01/2020 were unchanged. Findings most compatible with stable low-grade glioma.      Normal EEG awake, drowsy, and asleep (02/20/2018).   EEG (05/13/2021) :Routine video EEG was abnormal in wakefulness and sleep due to frequent focal epileptiform discharges in the left fronto-temporal regions.   OTHER MEDICAL CONDITIONS: Low grade glioma, Epilepsy   REVIEW OF SYSTEMS: Full 14 system review of systems performed and negative with exception of: As noted in the HPI   ALLERGIES: No Known Allergies  HOME MEDICATIONS: Outpatient Medications Prior to Visit  Medication Sig Dispense Refill   NAYZILAM  5 MG/0.1ML SOLN Place 5 mg into the nose as needed (place 1 spary into one nostril if seizures lasted longer than2- 3 minutes, may repeat a second dose after 10 minutes if still seizing.). 4 each 3   ondansetron  (ZOFRAN -ODT) 4 MG disintegrating tablet Take 1 tablet (4 mg total) by mouth every 8 (eight) hours as needed for nausea or vomiting. 20 tablet 0   levETIRAcetam  (KEPPRA ) 750 MG tablet TAKE 2 TABLETS(1500 MG) BY MOUTH TWICE DAILY 360 tablet 0   oxcarbazepine  (TRILEPTAL ) 600 MG tablet Take 1.5 tablets (900 mg total) by mouth 2 (two) times daily. 270 tablet 3   No facility-administered medications prior to visit.    PAST MEDICAL HISTORY: Past Medical History:  Diagnosis Date   Brain tumor (HCC)    Seizures (HCC)    Twin birth     PAST SURGICAL HISTORY: Past Surgical History:  Procedure Laterality Date   BRAIN BIOPSY     Jan/2024    FAMILY HISTORY: Family History  Problem Relation Age  of Onset   Stroke Maternal Grandfather     SOCIAL HISTORY: Social History   Socioeconomic History   Marital status: Single    Spouse  name: Not on file   Number of children: Not on file   Years of education: Not on file   Highest education level: Not on file  Occupational History   Not on file  Tobacco Use   Smoking status: Never   Smokeless tobacco: Never   Tobacco comments:    Patient smoke marijuana . Not ready to quit.   Vaping Use   Vaping status: Every Day  Substance and Sexual Activity   Alcohol use: Not on file   Drug use: Yes    Types: Marijuana   Sexual activity: Not on file  Other Topics Concern   Not on file  Social History Narrative   Brexlee is a graduated from [employer]   She currently attends [employer].    She lives with mom only. She has three siblings.   She enjoys running track, makeup, and fashion.   Social Drivers of Health   Tobacco Use: Low Risk (07/09/2024)   Patient History    Smoking Tobacco Use: Never    Smokeless Tobacco Use: Never    Passive Exposure: Not on file  Financial Resource Strain: Not on file  Food Insecurity: No Food Insecurity (07/22/2021)   Received from [facility] visits prior to 09/04/2022., [facility]   Epic    Within the past 12 months, you worried that your food would run out before you got the money to buy more.: Never true    Within the past 12 months, the food you bought just didn't last and you didn't have money to get more.: Never true  Transportation Needs: Not on file  Physical Activity: Not on file  Stress: Not on file  Social Connections: Not on file  Intimate Partner Violence: Not on file  Depression (EYV7-0): Not on file  Alcohol Screen: Not on file  Housing: Not on file  Utilities: Not on file  Health Literacy: Not on file    PHYSICAL EXAM  GENERAL EXAM/CONSTITUTIONAL: Vitals:  Vitals:   07/09/24 1030  BP: 118/72  Pulse: 85  SpO2: 98%  Weight: 163 lb (73.9 kg)  Height: 5' 1 (1.549 m)    Body mass index is 30.8 kg/m. Wt Readings from Last 3 Encounters:  07/09/24 163 lb (73.9 kg)  12/02/23 161 lb  (73 kg)  08/30/23 151 lb (68.5 kg) (81%, Z= 0.87)*   * Growth percentiles are based on CDC (Girls, 2-20 Years) data.   Patient is in no distress; well developed, nourished and groomed; neck is supple  MUSCULOSKELETAL: Gait, strength, tone, movements noted in Neurologic exam below  NEUROLOGIC: MENTAL STATUS:      No data to display         awake, alert, oriented to person, place and time recent and remote memory intact normal attention and concentration language fluent, comprehension intact, naming intact fund of knowledge appropriate  CRANIAL NERVE:  2nd, 3rd, 4th, 6th - Visual fields full to confrontation, extraocular muscles intact, no nystagmus 5th - facial sensation symmetric 7th - facial strength symmetric 8th - hearing intact 9th - palate elevates symmetrically, uvula midline 11th - shoulder shrug symmetric 12th - tongue protrusion midline  MOTOR:  normal bulk and tone, full strength in the BUE, BLE  SENSORY:  normal and symmetric to light touch  COORDINATION:  finger-nose-finger, fine finger  movements normal  GAIT/STATION:  normal   DIAGNOSTIC DATA (LABS, IMAGING, TESTING) - I reviewed patient records, labs, notes, testing and imaging myself where available.  Lab Results  Component Value Date   WBC 5.7 08/30/2023   HGB 12.3 08/30/2023   HCT 37.3 08/30/2023   MCV 84 08/30/2023   PLT 340 08/30/2023      Component Value Date/Time   NA 140 08/30/2023 1400   K 4.7 08/30/2023 1400   CL 103 08/30/2023 1400   CO2 20 08/30/2023 1400   GLUCOSE 93 08/30/2023 1400   GLUCOSE 211 (H) 08/26/2023 1155   BUN 18 08/30/2023 1400   CREATININE 2.14 (H) 08/30/2023 1400   CALCIUM 9.3 08/30/2023 1400   PROT 6.7 09/21/2023 1622   ALBUMIN 4.4 09/21/2023 1622   AST 16 09/21/2023 1622   ALT 16 09/21/2023 1622   ALKPHOS 73 09/21/2023 1622   BILITOT <0.2 09/21/2023 1622   GFRNONAA 53 (L) 08/26/2023 1155   GFRAA NOT CALCULATED 02/10/2018 0825   No results found  for: CHOL, HDL, LDLCALC, LDLDIRECT, TRIG No results found for: HGBA1C No results found for: VITAMINB12 No results found for: TSH  MRI Brain 02/19/2023 Infiltrative, masslike T2 signal in the medial aspect of the left occipital lobe with central area of cystic change, slightly increased in extent since 2021. No abnormal enhancement.  I personally reviewed brain Images   ASSESSMENT AND PLAN  19 y.o. year old female  with history of low-grade glioma, epilepsy who is presenting for follow up.  She did have 1 or 2 breakthrough seizures since last visit in May, does not remember exact date but tells me she has not had a seizure for the past 3 months.  She reports compliance with her medications and no side effect.  She feels one of her seizures were triggered by smoking vape which she has now stopped.  Plan will be for patient to continue current medications including levetiracetam  1500 mg twice daily and oxcarbazepine  900 mg.  I did advise her to contact me if she does have another seizure.  At that time we will consider VNS versus adding a third antiseizure medication.  I also did advise them to follow-up with her neurosurgeon at [facility].  They voiced understanding.  I will see him in 6 months for follow-up or sooner if worse.   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 Low grade glioma of brain [facility])       Patient Instructions  Continue levetiracetam  1500 mg twice daily Continue with oxcarbazepine  900 mg twice daily Refill given Please contact me if you have any another seizure.  At that time we will consider VNS versus third antiseizure medication Follow-up in 6 months or sooner if worse. At next visit we will obtain labs   Per [place]  DMV statutes, patients with seizures are not allowed to drive until they have been seizure-free for six months.  Other recommendations include using caution when using heavy equipment or power tools. Avoid working  on ladders or at heights. Take showers instead of baths.  Do not swim alone.  Ensure the water temperature is not too high on the home water heater. Do not go swimming alone. Do not lock yourself in a room alone (i.e. bathroom). When caring for infants or small children, sit down when holding, feeding, or changing them to minimize risk of injury to the child in the event you have a seizure. Maintain good sleep hygiene. Avoid alcohol.  Also recommend adequate sleep, hydration, good diet and minimize stress.   During the Seizure  - First, ensure adequate ventilation and place patients on the floor on their left side  Loosen clothing around the neck and ensure the airway is patent. If the patient is clenching the teeth, do not force the mouth open with any object as this can cause severe damage - Remove all items from the surrounding that can be hazardous. The patient may be oblivious to what's happening and may not even know what he or she is doing. If the patient is confused and wandering, either gently guide him/her away and block access to outside areas - Reassure the individual and be comforting - Call 911. In most cases, the seizure ends before EMS arrives. However, there are cases when seizures may last over 3 to 5 minutes. Or the individual may have developed breathing difficulties or severe injuries. If a pregnant patient or a person with diabetes develops a seizure, it is prudent to call an ambulance. - Finally, if the patient does not regain full consciousness, then call EMS. Most patients will remain confused for about 45 to 90 minutes after a seizure, so you must use judgment in calling for help. - Avoid restraints but make sure the patient is in a bed with padded side rails - Place the individual in a lateral position with the neck slightly flexed; this will help the saliva drain from the mouth and prevent the tongue from falling backward - Remove all nearby furniture and other hazards from  the area - Provide verbal assurance as the individual is regaining consciousness - Provide the patient with privacy if possible - Call for help and start treatment as ordered by the caregiver   After the Seizure (Postictal Stage)  After a seizure, most patients experience confusion, fatigue, muscle pain and/or a headache. Thus, one should permit the individual to sleep. For the next few days, reassurance is essential. Being calm and helping reorient the person is also of importance.  Most seizures are painless and end spontaneously. Seizures are not harmful to others but can lead to complications such as stress on the lungs, brain and the heart. Individuals with prior lung problems may develop labored breathing and respiratory distress.    Discussed Patients with epilepsy have a small risk of sudden unexpected death, a condition referred to as sudden unexpected death in epilepsy (SUDEP). SUDEP is defined specifically as the sudden, unexpected, witnessed or unwitnessed, nontraumatic and nondrowning death in patients with epilepsy with or without evidence for a seizure, and excluding documented status epilepticus, in which post mortem examination does not reveal a structural or toxicologic cause for death     No orders of the defined types were placed in this encounter.   Meds ordered this encounter  Medications   levETIRAcetam  (KEPPRA ) 750 MG tablet    Sig: Take 2 tablets (1,500 mg total) by mouth 2 (two) times daily.    Dispense:  360 tablet    Refill:  3   oxcarbazepine  (TRILEPTAL ) 600 MG tablet    Sig: Take 1.5 tablets (900 mg total) by mouth 2 (two) times daily.    Dispense:  270 tablet    Refill:  3    Return in about 6 months (around 01/06/2025).   The patient's condition of epilepsy requires frequent monitoring and adjustments in the treatment plan, reflecting the ongoing complexity of care.  This provider is the continuing focal point for all needed services for this  condition.   Pastor Falling, MD 07/09/2024, 10:57 AM  [facility] [address] [place], [place] [address] [phone]  "

## 2024-07-09 NOTE — Patient Instructions (Addendum)
 Continue levetiracetam  1500 mg twice daily Continue with oxcarbazepine  900 mg twice daily Refill given Please contact me if you have any another seizure.  At that time we will consider VNS versus third antiseizure medication Follow-up in 6 months or sooner if worse. At next visit we will obtain labs

## 2025-02-04 ENCOUNTER — Ambulatory Visit: Admitting: Neurology
